# Patient Record
Sex: Male | Born: 1965 | Race: White | Hispanic: No | Marital: Married | Smoking: Never smoker
Health system: Southern US, Community
[De-identification: ages and names within clinical notes are randomized; demographics above are authoritative.]

## PROBLEM LIST (undated history)

## (undated) DIAGNOSIS — K5792 Diverticulitis of intestine, part unspecified, without perforation or abscess without bleeding: Secondary | ICD-10-CM

## (undated) DIAGNOSIS — E119 Type 2 diabetes mellitus without complications: Secondary | ICD-10-CM

## (undated) DIAGNOSIS — K219 Gastro-esophageal reflux disease without esophagitis: Secondary | ICD-10-CM

## (undated) DIAGNOSIS — F419 Anxiety disorder, unspecified: Secondary | ICD-10-CM

## (undated) DIAGNOSIS — G4730 Sleep apnea, unspecified: Secondary | ICD-10-CM

## (undated) DIAGNOSIS — M47816 Spondylosis without myelopathy or radiculopathy, lumbar region: Secondary | ICD-10-CM

## (undated) DIAGNOSIS — I10 Essential (primary) hypertension: Secondary | ICD-10-CM

## (undated) DIAGNOSIS — Z0001 Encounter for general adult medical examination with abnormal findings: Secondary | ICD-10-CM

## (undated) HISTORY — PX: COLONOSCOPY: SHX174

## (undated) HISTORY — DX: Sleep apnea, unspecified: G47.30

## (undated) HISTORY — PX: WISDOM TOOTH EXTRACTION: SHX21

## (undated) HISTORY — DX: Type 2 diabetes mellitus without complications: E11.9

## (undated) HISTORY — DX: Anxiety disorder, unspecified: F41.9

## (undated) HISTORY — DX: Essential (primary) hypertension: I10

## (undated) HISTORY — DX: Gastro-esophageal reflux disease without esophagitis: K21.9

## (undated) HISTORY — DX: Spondylosis without myelopathy or radiculopathy, lumbar region: M47.816

## (undated) HISTORY — DX: Encounter for general adult medical examination with abnormal findings: Z00.01

---

## 2002-10-23 ENCOUNTER — Encounter: Payer: Self-pay | Admitting: Cardiology

## 2002-10-23 ENCOUNTER — Ambulatory Visit (HOSPITAL_COMMUNITY): Admission: RE | Admit: 2002-10-23 | Discharge: 2002-10-23 | Payer: Self-pay | Admitting: Cardiology

## 2004-03-13 ENCOUNTER — Encounter: Payer: Self-pay | Admitting: Internal Medicine

## 2004-03-13 LAB — CONVERTED CEMR LAB: PSA: 0.3 ng/mL

## 2004-10-08 ENCOUNTER — Ambulatory Visit: Payer: Self-pay | Admitting: Gastroenterology

## 2004-10-21 ENCOUNTER — Ambulatory Visit: Payer: Self-pay | Admitting: Gastroenterology

## 2005-12-12 ENCOUNTER — Emergency Department (HOSPITAL_COMMUNITY): Admission: EM | Admit: 2005-12-12 | Discharge: 2005-12-12 | Payer: Self-pay | Admitting: Emergency Medicine

## 2005-12-13 ENCOUNTER — Ambulatory Visit: Payer: Self-pay | Admitting: Internal Medicine

## 2006-01-20 ENCOUNTER — Ambulatory Visit: Payer: Self-pay | Admitting: Internal Medicine

## 2007-02-16 ENCOUNTER — Ambulatory Visit: Payer: Self-pay | Admitting: Internal Medicine

## 2007-08-11 ENCOUNTER — Encounter: Payer: Self-pay | Admitting: Internal Medicine

## 2007-08-11 DIAGNOSIS — E785 Hyperlipidemia, unspecified: Secondary | ICD-10-CM | POA: Insufficient documentation

## 2007-08-11 DIAGNOSIS — N289 Disorder of kidney and ureter, unspecified: Secondary | ICD-10-CM | POA: Insufficient documentation

## 2007-08-11 DIAGNOSIS — F339 Major depressive disorder, recurrent, unspecified: Secondary | ICD-10-CM | POA: Insufficient documentation

## 2007-08-11 DIAGNOSIS — Z8719 Personal history of other diseases of the digestive system: Secondary | ICD-10-CM | POA: Insufficient documentation

## 2007-08-11 DIAGNOSIS — E669 Obesity, unspecified: Secondary | ICD-10-CM | POA: Insufficient documentation

## 2007-08-11 DIAGNOSIS — I10 Essential (primary) hypertension: Secondary | ICD-10-CM

## 2007-08-15 DIAGNOSIS — G4733 Obstructive sleep apnea (adult) (pediatric): Secondary | ICD-10-CM | POA: Insufficient documentation

## 2007-12-11 ENCOUNTER — Ambulatory Visit: Payer: Self-pay | Admitting: Gastroenterology

## 2007-12-21 ENCOUNTER — Ambulatory Visit: Payer: Self-pay | Admitting: Gastroenterology

## 2007-12-21 LAB — CONVERTED CEMR LAB
ALT: 50 units/L (ref 0–53)
AST: 41 units/L — ABNORMAL HIGH (ref 0–37)
Albumin: 4.1 g/dL (ref 3.5–5.2)
Alkaline Phosphatase: 62 units/L (ref 39–117)
Bilirubin, Direct: 0.2 mg/dL (ref 0.0–0.3)
Total Bilirubin: 1.1 mg/dL (ref 0.3–1.2)
Total Protein: 7.4 g/dL (ref 6.0–8.3)

## 2007-12-27 ENCOUNTER — Encounter: Payer: Self-pay | Admitting: Internal Medicine

## 2007-12-27 ENCOUNTER — Ambulatory Visit: Payer: Self-pay | Admitting: Gastroenterology

## 2007-12-27 ENCOUNTER — Encounter: Payer: Self-pay | Admitting: Gastroenterology

## 2008-02-13 ENCOUNTER — Ambulatory Visit: Payer: Self-pay | Admitting: Gastroenterology

## 2008-02-20 ENCOUNTER — Ambulatory Visit (HOSPITAL_COMMUNITY): Admission: RE | Admit: 2008-02-20 | Discharge: 2008-02-20 | Payer: Self-pay | Admitting: Gastroenterology

## 2009-03-04 ENCOUNTER — Ambulatory Visit: Payer: Self-pay | Admitting: Internal Medicine

## 2009-03-04 DIAGNOSIS — N508 Other specified disorders of male genital organs: Secondary | ICD-10-CM

## 2009-03-12 ENCOUNTER — Telehealth: Payer: Self-pay | Admitting: Internal Medicine

## 2009-03-12 ENCOUNTER — Encounter: Admission: RE | Admit: 2009-03-12 | Discharge: 2009-03-12 | Payer: Self-pay | Admitting: Internal Medicine

## 2009-04-01 ENCOUNTER — Ambulatory Visit: Payer: Self-pay | Admitting: Internal Medicine

## 2009-04-01 LAB — CONVERTED CEMR LAB
AST: 41 units/L — ABNORMAL HIGH (ref 0–37)
Alkaline Phosphatase: 63 units/L (ref 39–117)
Eosinophils Absolute: 0.2 10*3/uL (ref 0.0–0.7)
Eosinophils Relative: 2.4 % (ref 0.0–5.0)
GFR calc non Af Amer: 76.83 mL/min (ref >60)
Glucose, Bld: 107 mg/dL — ABNORMAL HIGH (ref 70–99)
HDL: 35.1 mg/dL — ABNORMAL LOW (ref >39.00)
LDL Cholesterol: 107 mg/dL — ABNORMAL HIGH (ref 0–99)
Lymphocytes Relative: 22.1 % (ref 12.0–46.0)
MCV: 86.5 fL (ref 78.0–100.0)
Monocytes Absolute: 0.6 10*3/uL (ref 0.1–1.0)
Neutrophils Relative %: 67.4 % (ref 43.0–77.0)
PSA: 0.8 ng/mL (ref 0.10–4.00)
Platelets: 171 10*3/uL (ref 150.0–400.0)
Potassium: 3.9 meq/L (ref 3.5–5.1)
Sodium: 139 meq/L (ref 135–145)
Total Bilirubin: 1.7 mg/dL — ABNORMAL HIGH (ref 0.3–1.2)
Total CHOL/HDL Ratio: 5
Urobilinogen, UA: 0.2 (ref 0.0–1.0)
VLDL: 23.4 mg/dL (ref 0.0–40.0)
WBC: 8 10*3/uL (ref 4.5–10.5)

## 2009-04-07 ENCOUNTER — Ambulatory Visit: Payer: Self-pay | Admitting: Internal Medicine

## 2009-04-07 DIAGNOSIS — R9431 Abnormal electrocardiogram [ECG] [EKG]: Secondary | ICD-10-CM | POA: Insufficient documentation

## 2009-04-07 DIAGNOSIS — F528 Other sexual dysfunction not due to a substance or known physiological condition: Secondary | ICD-10-CM | POA: Insufficient documentation

## 2009-04-07 DIAGNOSIS — R030 Elevated blood-pressure reading, without diagnosis of hypertension: Secondary | ICD-10-CM | POA: Insufficient documentation

## 2009-04-07 DIAGNOSIS — J069 Acute upper respiratory infection, unspecified: Secondary | ICD-10-CM | POA: Insufficient documentation

## 2009-04-07 DIAGNOSIS — F411 Generalized anxiety disorder: Secondary | ICD-10-CM | POA: Insufficient documentation

## 2009-04-17 ENCOUNTER — Ambulatory Visit: Payer: Self-pay

## 2009-04-17 ENCOUNTER — Encounter: Payer: Self-pay | Admitting: Internal Medicine

## 2009-04-27 ENCOUNTER — Ambulatory Visit: Payer: Self-pay | Admitting: Professional

## 2009-04-27 ENCOUNTER — Ambulatory Visit: Payer: Self-pay | Admitting: Gastroenterology

## 2009-06-15 ENCOUNTER — Ambulatory Visit: Payer: Self-pay | Admitting: Professional

## 2009-07-09 ENCOUNTER — Ambulatory Visit: Payer: Self-pay | Admitting: Professional

## 2009-07-20 ENCOUNTER — Ambulatory Visit: Payer: Self-pay | Admitting: Professional

## 2009-07-27 ENCOUNTER — Ambulatory Visit: Payer: Self-pay | Admitting: Professional

## 2009-08-20 ENCOUNTER — Ambulatory Visit: Payer: Self-pay | Admitting: Professional

## 2009-09-05 ENCOUNTER — Emergency Department (HOSPITAL_COMMUNITY): Admission: EM | Admit: 2009-09-05 | Discharge: 2009-09-05 | Payer: Self-pay | Admitting: Emergency Medicine

## 2009-09-06 ENCOUNTER — Encounter (INDEPENDENT_AMBULATORY_CARE_PROVIDER_SITE_OTHER): Payer: Self-pay | Admitting: Emergency Medicine

## 2009-09-06 ENCOUNTER — Ambulatory Visit (HOSPITAL_COMMUNITY): Admission: RE | Admit: 2009-09-06 | Discharge: 2009-09-06 | Payer: Self-pay | Admitting: Emergency Medicine

## 2009-09-06 ENCOUNTER — Ambulatory Visit: Payer: Self-pay | Admitting: Vascular Surgery

## 2009-09-08 ENCOUNTER — Ambulatory Visit: Payer: Self-pay | Admitting: Internal Medicine

## 2009-09-08 DIAGNOSIS — M7989 Other specified soft tissue disorders: Secondary | ICD-10-CM

## 2009-09-08 DIAGNOSIS — S8010XA Contusion of unspecified lower leg, initial encounter: Secondary | ICD-10-CM

## 2009-09-09 ENCOUNTER — Ambulatory Visit (HOSPITAL_COMMUNITY): Admission: RE | Admit: 2009-09-09 | Discharge: 2009-09-09 | Payer: Self-pay | Admitting: Sports Medicine

## 2009-09-10 ENCOUNTER — Encounter: Payer: Self-pay | Admitting: Internal Medicine

## 2009-12-07 ENCOUNTER — Telehealth: Payer: Self-pay | Admitting: Internal Medicine

## 2010-01-01 ENCOUNTER — Telehealth: Payer: Self-pay | Admitting: Internal Medicine

## 2010-01-01 ENCOUNTER — Ambulatory Visit: Payer: Self-pay | Admitting: Internal Medicine

## 2010-01-01 DIAGNOSIS — S61409A Unspecified open wound of unspecified hand, initial encounter: Secondary | ICD-10-CM | POA: Insufficient documentation

## 2010-01-13 ENCOUNTER — Ambulatory Visit: Payer: Self-pay | Admitting: Internal Medicine

## 2010-08-30 ENCOUNTER — Ambulatory Visit: Payer: Self-pay | Admitting: Internal Medicine

## 2010-08-30 ENCOUNTER — Encounter: Payer: Self-pay | Admitting: Internal Medicine

## 2010-08-30 DIAGNOSIS — M79609 Pain in unspecified limb: Secondary | ICD-10-CM | POA: Insufficient documentation

## 2010-08-30 DIAGNOSIS — M549 Dorsalgia, unspecified: Secondary | ICD-10-CM | POA: Insufficient documentation

## 2010-08-31 ENCOUNTER — Ambulatory Visit: Payer: Self-pay

## 2010-08-31 ENCOUNTER — Encounter: Payer: Self-pay | Admitting: Internal Medicine

## 2010-10-27 ENCOUNTER — Ambulatory Visit: Payer: Self-pay | Admitting: Internal Medicine

## 2010-10-28 LAB — CONVERTED CEMR LAB
ALT: 38 units/L (ref 0–53)
AST: 31 units/L (ref 0–37)
Albumin: 4.5 g/dL (ref 3.5–5.2)
Bilirubin, Direct: 0.1 mg/dL (ref 0.0–0.3)
CO2: 26 meq/L (ref 19–32)
Calcium: 9.2 mg/dL (ref 8.4–10.5)
Glucose, Bld: 107 mg/dL — ABNORMAL HIGH (ref 70–99)
HDL: 33 mg/dL — ABNORMAL LOW (ref >39)
PSA: 0.62 ng/mL (ref <=4.00)
Potassium: 4.2 meq/L (ref 3.5–5.3)
Sodium: 139 meq/L (ref 135–145)
TSH: 3.562 microintl units/mL (ref 0.350–4.500)
Total Bilirubin: 0.7 mg/dL (ref 0.3–1.2)
Total CHOL/HDL Ratio: 4.9
VLDL: 36 mg/dL (ref 0–40)

## 2010-10-29 LAB — CONVERTED CEMR LAB
Basophils Absolute: 0.1 10*3/uL (ref 0.0–0.1)
Eosinophils Absolute: 0.3 10*3/uL (ref 0.0–0.7)
Hemoglobin: 16.4 g/dL (ref 13.0–17.0)
Leukocytes, UA: NEGATIVE
Lymphocytes Relative: 30.4 % (ref 12.0–46.0)
MCHC: 35.4 g/dL (ref 30.0–36.0)
Monocytes Relative: 8.9 % (ref 3.0–12.0)
Neutrophils Relative %: 55.6 % (ref 43.0–77.0)
Nitrite: NEGATIVE
RBC: 5.27 M/uL (ref 4.22–5.81)
RDW: 13.2 % (ref 11.5–14.6)
Specific Gravity, Urine: 1.015 (ref 1.000–1.030)
Urobilinogen, UA: 1 (ref 0.0–1.0)
pH: 8 (ref 5.0–8.0)

## 2010-11-02 ENCOUNTER — Ambulatory Visit: Payer: Self-pay | Admitting: Internal Medicine

## 2010-11-08 ENCOUNTER — Encounter: Payer: Self-pay | Admitting: Internal Medicine

## 2010-12-10 ENCOUNTER — Encounter: Payer: Self-pay | Admitting: Internal Medicine

## 2011-01-04 NOTE — Assessment & Plan Note (Signed)
Summary: PER DR AVP--12 DY SUCTURE REMOVAL--STC   Vital Signs:  Patient profile:   45 year old male Weight:      286 pounds Temp:     97.8 degrees F oral Pulse rate:   68 / minute BP sitting:   136 / 80  (left arm)  Vitals Entered By: Tora Perches (January 13, 2010 9:50 AM) CC: suture removal Is Patient Diabetic? No   Primary Care Provider:  Oliver Barre, MD  CC:  suture removal.  History of Present Illness: For suture removal  Preventive Screening-Counseling & Management  Alcohol-Tobacco     Smoking Status: never  Current Medications (verified): 1)  Diazepam 5 Mg Tabs (Diazepam) .Marland Kitchen.. 1p O Once Daily As Needed 2)  Cialis 20 Mg Tabs (Tadalafil) .Marland Kitchen.. 1 By Mouth Every Other Day As Needed  Allergies: 1)  ! * Niaspan 2)  ! * ?nerve Pill  Physical Exam  Skin:  wound healed   Impression & Recommendations:  Problem # 1:  WOUND, HAND (ICD-882.0) Assessment Improved  Sutures removed  Orders: No Charge Patient Arrived (NCPA0) (NCPA0)  Complete Medication List: 1)  Diazepam 5 Mg Tabs (Diazepam) .Marland Kitchen.. 1p o once daily as needed 2)  Cialis 20 Mg Tabs (Tadalafil) .Marland Kitchen.. 1 by mouth every other day as needed

## 2011-01-04 NOTE — Assessment & Plan Note (Signed)
Summary: PHYSICAL  STC   Vital Signs:  Patient profile:   45 year old male Height:      74 inches Weight:      290 pounds BMI:     37.37 O2 Sat:      94 % on Room air Temp:     99.3 degrees F oral Pulse rate:   74 / minute BP sitting:   118 / 72  (left arm) Cuff size:   large  Vitals Entered By: Zella Ball Ewing CMA Duncan Dull) (November 02, 2010 10:48 AM)  O2 Flow:  Room air  CC: Adult Physical/RE   Primary Care Provider:  Oliver Barre, MD  CC:  Adult Physical/RE.  History of Present Illness: here for wellness, unfortunately gained 30 lbs in the past yr with his orthopedic usses (left leg, then back pain), really no excercise significant for 6 months, fearful of re-injurying himself, has not seen ortho or other for his back. Pain still with low mid back and right side, , sometimes very severe alhtough some days little pain; nobowel or bladder changes, no LE pain/weak/numb, gait change or fall.      Pt denies CP, worsening sob, doe, wheezing, orthopnea, pnd, worsening LE edema, palps, dizziness or syncope  Pt denies new neuro symptoms such as headache, facial or extremity weakness  Pt denies polydipsia, polyuria, or low sugar symptoms such as shakiness improved with eating.  Overall good compliance with meds, trying to follow low chol  diet, little excercise however  Never filled the diazepam from last visit due to fear or somnolence.  Denies worsening depressive symptoms, suicidal ideation, or panic but has marked ongoing anxiety issues. has seen Dr Kaur/psychiatry and tried several SSRI in the past - did not seem to help so stopped, also had side effect with anorgasmia, and irritiable, and hyperhidrosis.  Overall good compliance with meds, and good tolerability.  No fever, wt loss, night sweats, loss of appetite or other constitutional symptoms Pt states good ability with ADL's, low fall risk, home safety reviewed and adequate, no significant change in hearing or vision, trying to follow lower  chol diet, and occasionally active only with regular excercise.   Problems Prior to Update: 1)  Leg Pain, Left  (ICD-729.5) 2)  Back Pain  (ICD-724.5) 3)  Arm Pain, Left  (ICD-729.5) 4)  Wound, Hand  (ICD-882.0) 5)  Contusion of Lower Leg  (ICD-924.10) 6)  Swelling of Limb  (ICD-729.81) 7)  Uri  (ICD-465.9) 8)  Erectile Dysfunction  (ICD-302.72) 9)  Anxiety  (ICD-300.00) 10)  Electrocardiogram, Abnormal  (ICD-794.31) 11)  Elevated Blood Pressure Without Diagnosis of Hypertension  (ICD-796.2) 12)  Preventive Health Care  (ICD-V70.0) 13)  Scrotal Mass  (ICD-608.89) 14)  Hypertension  (ICD-401.9) 15)  Hyperlipidemia  (ICD-272.4) 16)  Depression  (ICD-311) 17)  Fatty Liver Disease, Hx of  (ICD-V12.79) 18)  Obesity  (ICD-278.00) 19)  Sleep Apnea, Obstructive  (ICD-327.23) 20)  Nephrosis  (ICD-593.9)  Medications Prior to Update: 1)  Diazepam 5 Mg Tabs (Diazepam) .Marland Kitchen.. 1 Po Once Daily As Needed  Current Medications (verified): 1)  Flexeril 5 Mg Tabs (Cyclobenzaprine Hcl) .Marland Kitchen.. 1 By Mouth Three Times A Day As Needed 2)  Clonazepam 0.5 Mg Tabs (Clonazepam) .Marland Kitchen.. 1po Two Times A Day As Needed  For Nerves  Allergies (verified): 1)  ! * Niaspan 2)  ! * ?nerve Pill  Past History:  Past Surgical History: Last updated: 08/11/2007 Denies surgical history  Family History: Last updated: 04/27/2009 father  died with pancreatic cancer at 44yo grandmotherwith heart diseaes - died at 17yo No FH of Colon Cancer:  Social History: Last updated: 04/27/2009 Occupation: Airline pilot - organic foods Married 2 daughters Never Smoked Alcohol use-yes - socal only Daily Caffeine Use - unsweet tea Illicit Drug Use - no  Risk Factors: Smoking Status: never (01/13/2010)  Past Medical History: Depression Hyperlipidemia Hypertension Obstructive Sleep Apnea, CPAP Obesity Fatty Liver Disease Nephrosis Esophageal Stricture with dilation 2009 Colon Polyps Anxiety LE varicose veins/venous  insufficien cy  Review of Systems  The patient denies anorexia, fever, vision loss, decreased hearing, hoarseness, chest pain, syncope, dyspnea on exertion, peripheral edema, prolonged cough, headaches, hemoptysis, abdominal pain, melena, hematochezia, severe indigestion/heartburn, hematuria, muscle weakness, suspicious skin lesions, transient blindness, depression, unusual weight change, abnormal bleeding, enlarged lymph nodes, and angioedema.         all otherwise negative per pt -    Physical Exam  General:  alert and overweight-appearing.   Head:  normocephalic and atraumatic.   Eyes:  vision grossly intact, pupils equal, and pupils round.   Ears:  R ear normal and L ear normal.   Nose:  no external deformity and no nasal discharge.   Mouth:  no gingival abnormalities and pharynx pink and moist.   Neck:  supple and no masses.   Lungs:  normal respiratory effort and normal breath sounds.   Heart:  normal rate and regular rhythm.   Abdomen:  soft, non-tender, and normal bowel sounds.   Msk:  no joint tenderness and no joint swelling.  , spine nontender, no significant paravertebral tender, swelling or rash noted Extremities:  no edema, no erythema  Neurologic:  strength normal in all extremities, sensation intact to light touch, gait normal, and DTRs symmetrical and normal.   Skin:  color normal and no rashes.   Psych:  not depressed appearing and moderately anxious.     Impression & Recommendations:  Problem # 1:  Preventive Health Care (ICD-V70.0) Overall doing well, age appropriate education and counseling updated, referral for preventive services and immunizations addressed, dietary counseling and smoking status adressed , most recent labs reviewed I have personally reviewed and have noted 1.The patient's medical and social history 2.Their use of alcohol, tobacco or illicit drugs 3.Their current medications and supplements 4. Functional ability including ADL's, fall risk,  home safety risk, hearing & visual impairment  5.Diet and physical activities 6.Evidence for depression or mood disorders The patients weight, height, BMI  have been recorded in the chart I have made referrals, counseling and provided education to the patient based review of the above   Problem # 2:  BACK PAIN (ICD-724.5)  ongoing now chronic, assoc with reduced activity and increase wt, fearful of doing any excericse - will refer to orthopedic for further eval, for flexeril as needed, hopefuly to get to playing bball and wt loss  Orders: Orthopedic Surgeon Referral (Ortho Surgeon)  His updated medication list for this problem includes:    Flexeril 5 Mg Tabs (Cyclobenzaprine hcl) .Marland Kitchen... 1 by mouth three times a day as needed  Problem # 3:  ANXIETY (ICD-300.00)  The following medications were removed from the medication list:    Diazepam 5 Mg Tabs (Diazepam) .Marland Kitchen... 1 po once daily as needed His updated medication list for this problem includes:    Clonazepam 0.5 Mg Tabs (Clonazepam) .Marland Kitchen... 1po two times a day as needed  for nerves treat as above, f/u any worsening signs or symptoms ., declines SSRI trial  Complete Medication List: 1)  Flexeril 5 Mg Tabs (Cyclobenzaprine hcl) .Marland Kitchen.. 1 by mouth three times a day as needed 2)  Clonazepam 0.5 Mg Tabs (Clonazepam) .Marland Kitchen.. 1po two times a day as needed  for nerves  Patient Instructions: 1)  Please take all new medications as prescribed 2)  Continue all previous medications as before this visit  3)  You will be contacted about the referral(s) to: orthopedic 4)  Please schedule a follow-up appointment in 1 year, or sooner if needed Prescriptions: CLONAZEPAM 0.5 MG TABS (CLONAZEPAM) 1po two times a day as needed  for nerves  #60 x 2   Entered and Authorized by:   Corwin Levins MD   Signed by:   Corwin Levins MD on 11/02/2010   Method used:   Print then Give to Patient   RxID:   [id number] FLEXERIL 5 MG TABS (CYCLOBENZAPRINE HCL) 1 by mouth  three times a day as needed  #90 x 1   Entered and Authorized by:   Corwin Levins MD   Signed by:   Corwin Levins MD on 11/02/2010   Method used:   Print then Give to Patient   RxID:   [id number]    Orders Added: 1)  Orthopedic Surgeon Referral Gaylord Shih Surgeon] 2)  Est. Patient 40-64 years [phone]

## 2011-01-04 NOTE — Progress Notes (Signed)
Summary: Wound on hand  Phone Note Call from Patient   Summary of Call: Pt cut his hand with a box cutter about 1 & 1/2 hours ago. Per pt wound is approx 1 inch long and "deep". He has been applying direct pressure and it has not stopped bleeding. He does not want to go to the ER and feels that the cut may need sutures. Ok per Dr Posey Rea to add to open 3 pm 30 apt. Pt informed, added to schedule.  Initial call taken by: Lamar Sprinkles, CMA,  January 01, 2010 1:28 PM  Follow-up for Phone Call        Thx Follow-up by: Tresa Garter MD,  January 01, 2010 4:59 PM

## 2011-01-04 NOTE — Assessment & Plan Note (Signed)
Summary: cut on hand-lb   Vital Signs:  Patient profile:   45 year old male Weight:      286 pounds Temp:     98.2 degrees F oral Pulse rate:   75 / minute BP sitting:   142 / 88  Vitals Entered By: Tora Perches (January 01, 2010 2:24 PM)  CC: cut right hand---need sutures Is Patient Diabetic? No   Primary Care Provider:  Oliver Barre, MD  CC:  cut right hand---need sutures.  History of Present Illness: Pt was worked in for a L hand cut w/a boxcutter earlier today. He bled a lot.  Preventive Screening-Counseling & Management  Alcohol-Tobacco     Smoking Status: never  Current Medications (verified): 1)  Diazepam 5 Mg Tabs (Diazepam) .Marland Kitchen.. 1p O Once Daily As Needed 2)  Cialis 20 Mg Tabs (Tadalafil) .Marland Kitchen.. 1 By Mouth Every Other Day As Needed  Allergies: 1)  ! * Niaspan 2)  ! 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   His pants a soaked with blood over B anter thighs, shirt stained a lot. No palor Skin:  2.6 cm lacer L hand at the base of his thumb - still bleeding   Impression & Recommendations:  Problem # 1:  WOUND, HAND (ICD-882.0) w/acute bleeding Assessment New  3.0 ethilone x 5 sutures dT up-to-date Risks including but not limited to bleeding, infection were discussed with the patient. Consent form was signed.  Procedure: laceration repair The area was cleaned w/betadine and injected with 4.0 cc of 2% Lido w/epi. The wound was cleaned and redressed  w/a fenestrated sheet. 5 simple sutures with 3.0 ethylon applied. Wound dressed. Wound care instructions provided. RTC for suture removal in 12 d Tolerated well. Complicatons - none.   Orders: Ace Wraps 3-5 in/yard  (Z6109) Lacerat Simple STE 2.6 - 7.5 cm (12002)  Complete Medication List: 1)  Diazepam 5 Mg Tabs (Diazepam) .Marland Kitchen.. 1p o once daily as needed 2)  Cialis 20 Mg Tabs (Tadalafil) .Marland Kitchen.. 1 by mouth every other day as needed  Patient Instructions: 1)  RTC 12 d

## 2011-01-04 NOTE — Progress Notes (Signed)
Summary: med refill  Phone Note Refill Request  on December 07, 2009 11:33 AM  Refills Requested: Medication #1:  DIAZEPAM 5 MG TABS 1p o once daily as needed   Dosage confirmed as above?Dosage Confirmed   Notes: [facility] [phone] Initial call taken by: Scharlene Gloss,  December 07, 2009 11:33 AM  Follow-up for Phone Call        done hardcopy to LIM side B - dahlia  Follow-up by: Corwin Levins MD,  December 07, 2009 1:05 PM  Additional Follow-up for Phone Call Additional follow up Details #1::        rx faxed to pharmacy Additional Follow-up by: Margaret Pyle, CMA,  December 07, 2009 2:10 PM    New/Updated Medications: DIAZEPAM 5 MG TABS (DIAZEPAM) 1p o once daily as needed Prescriptions: DIAZEPAM 5 MG TABS (DIAZEPAM) 1p o once daily as needed  #30 x 3   Entered and Authorized by:   Corwin Levins MD   Signed by:   Corwin Levins MD on 12/07/2009   Method used:   Print then Give to Patient   RxID:   [id number]

## 2011-01-04 NOTE — Miscellaneous (Signed)
Summary: Orders Update  Clinical Lists Changes  Orders: Added new Test order of Venous Duplex Lower Extremity (Venous Duplex Lower) - Signed  Appended Document: Orders Update recieved phone from vasc lab earlier this AM  - neg for DVT,  LMOPT - labs negative, normal, or stable

## 2011-01-04 NOTE — Assessment & Plan Note (Signed)
Summary: SWOLLEN LEG/NWS   Vital Signs:  Patient profile:   45 year old male Height:      72 inches Weight:      288.13 pounds BMI:     39.22 O2 Sat:      93 % on Room air Temp:     98.4 degrees F oral Pulse rate:   77 / minute BP sitting:   132 / 80  (left arm) Cuff size:   large  Vitals Entered By: Zella Ball Ewing CMA Duncan Dull) (August 30, 2010 2:42 PM)  O2 Flow:  Room air CC: left leg swollen and painful, back and left arm pain/RE   Primary Care Provider:  Oliver Barre, MD  CC:  left leg swollen and painful and back and left arm pain/RE.  History of Present Illness: here with acute  - c/o primarily left calf pain, tenderness and swelling for 2 days, without knee or upper leg symptoms.  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  Pt denies new neuro symptoms such as headache, facial or extremity weakness  No fever, wt loss, night sweats, loss of appetite or other constitutional symptoms  Does have hx of partial tear to the same calf area earlier in the year tx conservatively and healed with crutches , elev and non wt bearing for 4 wks (see recent ortho note).  No falls or other injury or heavy lifting.  No hx of DVT/PE .   Also c/o vague discomfort, achy, dull , intermittent to distal LUE (? wrist and hand), as well as area just medial to the left scapula,  all without tenderness, sweling, erythema , rash or drainage, bowel or bladder change, gait change, fever or wt loss.  Denies worsening depressive symtpoms, but states has ongoing anxiety, mild to mod, having done well with diazepam.  No panic symptoms  Problems Prior to Update: 1)  Leg Pain, Left  (ICD-729.5) 2)  Back Pain  (ICD-724.5) 3)  Arm Pain, Left  (ICD-729.5) 4)  Wound, Hand  (ICD-882.0) 5)  Contusion of Lower Leg   (ICD-924.10) 6)  Swelling of Limb  (ICD-729.81) 7)  Uri  (ICD-465.9) 8)  Erectile Dysfunction  (ICD-302.72) 9)  Anxiety  (ICD-300.00) 10)  Electrocardiogram, Abnormal  (ICD-794.31) 11)  Elevated Blood Pressure Without Diagnosis of Hypertension  (ICD-796.2) 12)  Preventive Health Care  (ICD-V70.0) 13)  Scrotal Mass  (ICD-608.89) 14)  Hypertension  (ICD-401.9) 15)  Hyperlipidemia  (ICD-272.4) 16)  Depression  (ICD-311) 17)  Fatty Liver Disease, Hx of  (ICD-V12.79) 18)  Obesity  (ICD-278.00) 19)  Sleep Apnea, Obstructive  (ICD-327.23) 20)  Nephrosis  (ICD-593.9)  Medications Prior to Update: 1)  Diazepam 5 Mg Tabs (Diazepam) .Marland Kitchen.. 1p O Once Daily As Needed 2)  Cialis 20 Mg Tabs (Tadalafil) .Marland Kitchen.. 1 By Mouth Every Other Day As Needed  Current Medications (verified): 1)  Diazepam 5 Mg Tabs (Diazepam) .Marland Kitchen.. 1 Po Once Daily As Needed  Allergies (verified): 1)  ! * Niaspan 2)  ! 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   Head:  normocephalic and atraumatic.   Eyes:  vision grossly intact, pupils equal, and pupils round.   Ears:  R ear normal and L ear normal.   Nose:  no external deformity and no nasal discharge.   Mouth:  no gingival abnormalities and pharynx pink and moist.   Neck:  supple and no masses.   Lungs:  normal  respiratory effort and normal breath sounds.   Heart:  normal rate and regular rhythm.   Msk:  no joint tenderness and no joint swelling.  , but does have mild edema  without erythema or rash to the left leg below the knee, with tender without cords to the left post calf, and equivocal  homans sign  also no spine or parasplinal tedner or sweling Pulses:  1+ dorsalis pedis  bilat Extremities:  no edema, no erythema  Neurologic:  cranial nerves II-XII intact, strength normal in all extremities, and gait somewhat antalgic favoring the LLEcranial nerves II-XII intact, strength normal in all extremities, sensation intact to light touch, and gait normal.   Psych:  dysphoric affect and moderately anxious.     Impression & Recommendations:  Problem # 1:  SWELLING OF LIMB (ICD-729.81)  and left calf/leg pain - for LLE venous doppler today stat , pain control  Orders: Radiology Referral (Radiology)  Problem # 2:  ARM PAIN, LEFT (ICD-729.5)  c/w MSK vs neuritic, for ecg today, but mild and pt declines further eval at this time  Orders: EKG w/ Interpretation (93000)  Problem # 3:  BACK PAIN (ICD-724.5) exam benign, ok to follow  Problem # 4:  ANXIETY (ICD-300.00)  His updated medication list for this problem includes:    Diazepam 5 Mg Tabs (Diazepam) .Marland Kitchen... 1 po once daily as needed treat as above, f/u any worsening signs or symptoms   Complete Medication List: 1)  Diazepam 5 Mg Tabs (Diazepam) .Marland Kitchen.. 1 po once daily as needed  Patient Instructions: 1)  You will be contacted about the referral(s) to: PCC's today for the stat left leg doppler venous ultrasound 2)  Your EKG was good today 3)  Please call or return if symptoms related to the left arm or back pain seem worse 4)  Continue all previous medications as before this visit  5)  Please schedule a follow-up appointment in 1 month with CPX labs  Prescriptions: DIAZEPAM 5 MG TABS (DIAZEPAM) 1 po once daily as needed  #30 x 5    Entered and Authorized by:   Corwin Levins MD   Signed by:   Corwin Levins MD on 08/30/2010   Method used:   Print then Give to Patient   RxID:   [id number]

## 2011-01-04 NOTE — Letter (Signed)
Summary: [facility]  [facility]   Imported By: Lester Etna 11/12/2010 09:07:36  _____________________________________________________________________  External Attachment:    Type:   Image     Comment:   External Document

## 2011-01-06 NOTE — Letter (Signed)
Summary: Nat Math DO/[facility]   T Frazier Butt DO/[facility]   Imported By: Lester Eunice 12/29/2010 10:31:46  _____________________________________________________________________  External Attachment:    Type:   Image     Comment:   External Document

## 2011-04-19 NOTE — Assessment & Plan Note (Signed)
[facility]                         GASTROENTEROLOGY OFFICE NOTE   William, [name]                      MRN:          [id number]  DATE:02/13/2008                            DOB:          February 12, 1966    PROBLEM:  Dysphagia.   William [name] is a 45 year old white male returning following upper  endoscopy and colonoscopy.  He has dysphagia prompted on upper  endoscopy, which demonstrated a distal esophageal stricture.  There were  some linear furrows suggesting eosinophilic esophagitis.  Biopsies did  demonstrate increased esophagitis, though it was felt that this was more  likely due to reflux esophagitis.  He had mild duodenitis as well.  He  has a history of an adenomatous colon polyp, though the most recent  colonoscopy was negative.  He also has history of hepatic steatosis, and  underwent ultrasound in 2001.  William [name] has had 1 episode of mild  dysphagia to solids since his dilatation.  Prior to that he had been  having multiple episodes.   PAST MEDICAL HISTORY:  Unremarkable.   FAMILY HISTORY:  Pertinent for father who had pancreatic cancer at 3.   MEDICATIONS:  The only medication is baby aspirin.   HE IS ALLERGIC TO NIASPAN.   He does not smoke.  He drinks rarely.  He is married and works in Airline pilot.   REVIEW OF SYSTEMS:  Negative.   EXAMINATION:  Pulse 80.  Blood pressure 146/94.  Weight 300.  HEENT: EOMI.  PERRLA.  Sclerae are anicteric.  Conjunctivae are pink.  NECK:  Supple without thyromegaly, adenopathy or carotid bruits.  CHEST:  Clear to auscultation and percussion without adventitious  sounds.  CARDIAC:  Regular rhythm; normal S1 S2.  There are no murmurs, gallops  or rubs.  ABDOMEN:  Bowel sounds are normoactive.  Abdomen is soft, nontender and  nondistended.  There are no abdominal masses, tenderness, splenic  enlargement or hepatomegaly.  EXTREMITIES:  Full range of motion.  No cyanosis, clubbing or edema.  RECTAL:   Deferred.   IMPRESSION:  1. Mildly symptomatic distal esophageal stricture.  Question of      eosinophilic esophagitis was raised, though I think it is less      likely.  2. History of colon polyps.  3. Hepatic steatosis.   RECOMMENDATIONS:  1. Repeat dilatation as needed.  2. Trial of Protonix 40 mg a day.  3. Followup ultrasound to reassess the liver.     Barbette Hair. Arlyce Dice, MD,FACG  Electronically Signed    RDK/MedQ  DD: 02/13/2008  DT: 02/13/2008  Job #: [id number]

## 2011-04-22 NOTE — Cardiovascular Report (Signed)
William [name], William [name]                         ACCOUNT NO.:  [id number]   MEDICAL RECORD NO.:  [id number]                   PATIENT TYPE:  OIB   LOCATION:  2899                                 FACILITY:  [facility]   PHYSICIAN:  Cristy Hilts. Jacinto Halim, M.D.                  DATE OF BIRTH:  1966/04/11   DATE OF PROCEDURE:  10/23/2002  DATE OF DISCHARGE:                              CARDIAC CATHETERIZATION   REFERRING PHYSICIANS:  Louanna Raw, M.D. and Dr. Dara Hoyer   PROCEDURES PERFORMED:  Left heart catheterization including:  1. Left ventriculography.  2. Selective right and left coronary arteriography.  3. Abdominal aortogram with visualization of renal arteries.  4. Right femoral angiography with closure of right femoral artery access     with Perclose.   INDICATION:  Mr. William [name] is a 45 year old gentleman with significant  family history of premature coronary artery disease and history of  hypertension, who underwent a Cardiolite stress test which revealed anterior  wall ischemia.  Given this, he was brought to the cardiac catheterization  laboratory to evaluate his coronary anatomy.   HEMODYNAMIC DATA:  The left ventricular pressures 104/4 with a end-diastolic  pressure of 15 mmHg. The aortic pressure was 97/66 with a mean of 81 mmHg.  There was no pressure gradient across the aortic valve.   ANGIOGRAPHIC DATA:   LEFT VENTRICULOGRAM:  The left ventricular systolic function was normal.  The ejection fraction was estimated at 55%.  There was no wall motion  abnormality.   Right coronary artery:  The right coronary artery is a large caliber vessel.  It is a dominant vessel.  It gives a very large posterior and left  ventricular branch.  It is disease-free.   Left main coronary artery:  The left main coronary artery is a large caliber  vessel. It is disease-free.   Left anterior descending artery:  Left anterior descending artery is a large  caliber vessel.  It gives rise to  a small diagonal and several small septal  perforators.  This wraps around the apex.  In the mid segment of the left  anterior descending artery, it dips down intramyocardially and there is  significant kinking noted with mild myocardial bridging effect noted in the  mid left anterior descending artery.  Otherwise the vessel itself is very  smooth and is normal.   Ramus intermediate:  The ramus intermediate is a very large artery.  It  replaces the diagonal #1.  It is disease-free.   Circumflex coronary artery:  The circumflex coronary artery is a large  caliber vessel.  It gives origin to a moderate sized obtuse marginal #1 and  continues as obtuse marginal #2.  It is disease-free.   IMPRESSION:  1. Normal left ventricular systolic function with ejection fraction of 55%.  2. Slow filling noted in the coronaries but no plaques.  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  3. The coronary arteries are otherwise normal except the left anterior     descending dips down into the myocardium with mild intramyocardial     bridging in the mid segment probably explaining his ischemia in the     Cardiolite stress test.   RECOMMENDATIONS:  Based on his coronary anatomy, continued medical therapy  is advised with aggressive control of hypertension, obesity with weight  reduction.   TECHNIQUE OF PROCEDURE:  Under the usual sterile precautions, using a 6  French right femoral artery access, a 6 [place] multipurpose B2 catheter was  advanced into the ascending aorta over a 0.035 inch J wire.  The catheter  was gently advanced to the left ventricle and left ventricular pressures  were monitored. Hand contrast injection of the left ventricle was performed  both in LAO and RAO projections.  The catheter was  flushed with saline and pulled back into the ascending aorta and pressure  gradient across the aortic valve was monitored.  The right coronary artery  was selectively engaged and  angiography was performed.  Then the catheter  was pulled back into the abdominal aorta and abdominal aortogram was  performed.  Then the catheter was pulled out of the body.   The left main coronary artery was selective engaged with a 6 French  diagnostic left Judkins catheter in the usual fashion, and angiography was  performed.  Then the catheter was pulled out of the body.  Right femoral  angiography was performed through the arterial access sheath and access was  closed with Perclose with excellent hemostasis obtained.  The patient was  transferred to recovery room in stable condition.  The patient tolerated the  procedure well.                                                  Cristy Hilts. Jacinto Halim, M.D.    Pilar Plate  D:  10/23/2002  T:  10/23/2002  Job:  [id number]

## 2012-08-07 ENCOUNTER — Ambulatory Visit (INDEPENDENT_AMBULATORY_CARE_PROVIDER_SITE_OTHER): Payer: 59 | Admitting: Professional

## 2012-08-07 DIAGNOSIS — F4322 Adjustment disorder with anxiety: Secondary | ICD-10-CM

## 2012-12-17 ENCOUNTER — Encounter: Payer: Self-pay | Admitting: Gastroenterology

## 2013-04-30 ENCOUNTER — Ambulatory Visit (INDEPENDENT_AMBULATORY_CARE_PROVIDER_SITE_OTHER): Payer: 59 | Admitting: Professional

## 2013-04-30 DIAGNOSIS — F4322 Adjustment disorder with anxiety: Secondary | ICD-10-CM

## 2013-05-07 ENCOUNTER — Ambulatory Visit (INDEPENDENT_AMBULATORY_CARE_PROVIDER_SITE_OTHER): Payer: 59 | Admitting: Professional

## 2013-05-07 DIAGNOSIS — F4322 Adjustment disorder with anxiety: Secondary | ICD-10-CM

## 2013-05-14 ENCOUNTER — Ambulatory Visit (INDEPENDENT_AMBULATORY_CARE_PROVIDER_SITE_OTHER): Payer: 59 | Admitting: Professional

## 2013-05-14 DIAGNOSIS — F4322 Adjustment disorder with anxiety: Secondary | ICD-10-CM

## 2013-05-21 ENCOUNTER — Ambulatory Visit (INDEPENDENT_AMBULATORY_CARE_PROVIDER_SITE_OTHER): Payer: 59 | Admitting: Professional

## 2013-05-21 DIAGNOSIS — F4322 Adjustment disorder with anxiety: Secondary | ICD-10-CM

## 2013-05-31 ENCOUNTER — Encounter: Payer: Self-pay | Admitting: Gastroenterology

## 2013-06-04 ENCOUNTER — Ambulatory Visit (INDEPENDENT_AMBULATORY_CARE_PROVIDER_SITE_OTHER): Payer: 59 | Admitting: Professional

## 2013-06-04 DIAGNOSIS — F4322 Adjustment disorder with anxiety: Secondary | ICD-10-CM

## 2013-06-11 ENCOUNTER — Ambulatory Visit (INDEPENDENT_AMBULATORY_CARE_PROVIDER_SITE_OTHER): Payer: 59 | Admitting: Professional

## 2013-06-11 DIAGNOSIS — F4322 Adjustment disorder with anxiety: Secondary | ICD-10-CM

## 2013-06-18 ENCOUNTER — Ambulatory Visit (INDEPENDENT_AMBULATORY_CARE_PROVIDER_SITE_OTHER): Payer: 59 | Admitting: Professional

## 2013-06-18 DIAGNOSIS — F4322 Adjustment disorder with anxiety: Secondary | ICD-10-CM

## 2013-06-25 ENCOUNTER — Ambulatory Visit (INDEPENDENT_AMBULATORY_CARE_PROVIDER_SITE_OTHER): Payer: 59 | Admitting: Professional

## 2013-06-25 DIAGNOSIS — F4322 Adjustment disorder with anxiety: Secondary | ICD-10-CM

## 2013-07-02 ENCOUNTER — Ambulatory Visit: Payer: 59 | Admitting: Professional

## 2013-07-10 ENCOUNTER — Encounter (HOSPITAL_BASED_OUTPATIENT_CLINIC_OR_DEPARTMENT_OTHER): Payer: Self-pay | Admitting: Emergency Medicine

## 2013-07-10 ENCOUNTER — Emergency Department (HOSPITAL_BASED_OUTPATIENT_CLINIC_OR_DEPARTMENT_OTHER)
Admission: EM | Admit: 2013-07-10 | Discharge: 2013-07-11 | Disposition: A | Discharge status: Home / Self Care | Payer: Managed Care, Other (non HMO) | Attending: Emergency Medicine | Admitting: Emergency Medicine

## 2013-07-10 DIAGNOSIS — Z7982 Long term (current) use of aspirin: Secondary | ICD-10-CM | POA: Insufficient documentation

## 2013-07-10 DIAGNOSIS — R51 Headache: Secondary | ICD-10-CM | POA: Insufficient documentation

## 2013-07-10 DIAGNOSIS — K5732 Diverticulitis of large intestine without perforation or abscess without bleeding: Secondary | ICD-10-CM | POA: Insufficient documentation

## 2013-07-10 DIAGNOSIS — Z79899 Other long term (current) drug therapy: Secondary | ICD-10-CM | POA: Insufficient documentation

## 2013-07-10 HISTORY — DX: Diverticulitis of intestine, part unspecified, without perforation or abscess without bleeding: K57.92

## 2013-07-10 LAB — CBC
HCT: 48.7 % (ref 39.0–52.0)
Hemoglobin: 17.5 g/dL — ABNORMAL HIGH (ref 13.0–17.0)
MCHC: 35.9 g/dL (ref 30.0–36.0)
RDW: 13.7 % (ref 11.5–15.5)
WBC: 16.1 10*3/uL — ABNORMAL HIGH (ref 4.0–10.5)

## 2013-07-10 MED ORDER — MORPHINE SULFATE 4 MG/ML IJ SOLN
4.0000 mg | Freq: Once | INTRAMUSCULAR | Status: AC
  Administered 2013-07-11: 4 mg via INTRAVENOUS
  Filled 2013-07-10: qty 1

## 2013-07-10 MED ORDER — ONDANSETRON HCL 4 MG/2ML IJ SOLN
4.0000 mg | Freq: Once | INTRAMUSCULAR | Status: AC
  Administered 2013-07-11: 4 mg via INTRAVENOUS
  Filled 2013-07-10: qty 2

## 2013-07-10 MED ORDER — SODIUM CHLORIDE 0.9 % IV BOLUS (SEPSIS)
1000.0000 mL | Freq: Once | INTRAVENOUS | Status: AC
  Administered 2013-07-11: 1000 mL via INTRAVENOUS

## 2013-07-10 NOTE — ED Notes (Signed)
Pt c/o lower abd pain since 10 am. Pt also reports HA. Pt denies diarrhea.

## 2013-07-11 ENCOUNTER — Emergency Department (HOSPITAL_BASED_OUTPATIENT_CLINIC_OR_DEPARTMENT_OTHER): Payer: Managed Care, Other (non HMO)

## 2013-07-11 ENCOUNTER — Encounter (HOSPITAL_BASED_OUTPATIENT_CLINIC_OR_DEPARTMENT_OTHER): Payer: Self-pay

## 2013-07-11 LAB — COMPREHENSIVE METABOLIC PANEL
ALT: 33 U/L (ref 0–53)
Albumin: 4.6 g/dL (ref 3.5–5.2)
Alkaline Phosphatase: 74 U/L (ref 39–117)
BUN: 10 mg/dL (ref 6–23)
Chloride: 97 mEq/L (ref 96–112)
Potassium: 4.3 mEq/L (ref 3.5–5.1)
Sodium: 135 mEq/L (ref 135–145)
Total Bilirubin: 0.9 mg/dL (ref 0.3–1.2)
Total Protein: 8.3 g/dL (ref 6.0–8.3)

## 2013-07-11 LAB — URINALYSIS, ROUTINE W REFLEX MICROSCOPIC
Bilirubin Urine: NEGATIVE
Hgb urine dipstick: NEGATIVE
Nitrite: NEGATIVE
Specific Gravity, Urine: 1.015 (ref 1.005–1.030)
pH: 5.5 (ref 5.0–8.0)

## 2013-07-11 MED ORDER — IBUPROFEN 800 MG PO TABS: 800.0000 mg | ORAL_TABLET | Freq: Once | ORAL | Status: AC

## 2013-07-11 MED ORDER — CIPROFLOXACIN HCL 500 MG PO TABS
500.0000 mg | ORAL_TABLET | Freq: Once | ORAL | Status: AC
  Administered 2013-07-11: 500 mg via ORAL
  Filled 2013-07-11: qty 1

## 2013-07-11 MED ORDER — OXYCODONE-ACETAMINOPHEN 5-325 MG PO TABS: 1.0000 | ORAL_TABLET | Freq: Four times a day (QID) | ORAL | Status: DC | PRN

## 2013-07-11 MED ORDER — IOHEXOL 300 MG/ML  SOLN
50.0000 mL | Freq: Once | INTRAMUSCULAR | Status: AC | PRN
  Administered 2013-07-11: 50 mL via ORAL

## 2013-07-11 MED ORDER — METRONIDAZOLE 500 MG PO TABS: 500.0000 mg | ORAL_TABLET | Freq: Three times a day (TID) | ORAL | Status: DC

## 2013-07-11 MED ORDER — IBUPROFEN 800 MG PO TABS
ORAL_TABLET | ORAL | Status: AC
  Administered 2013-07-11: 800 mg via ORAL
  Filled 2013-07-11: qty 1

## 2013-07-11 MED ORDER — ACETAMINOPHEN 500 MG PO TABS
ORAL_TABLET | ORAL | Status: AC
  Filled 2013-07-11: qty 2

## 2013-07-11 MED ORDER — IOHEXOL 300 MG/ML  SOLN
100.0000 mL | Freq: Once | INTRAMUSCULAR | Status: AC | PRN
  Administered 2013-07-11: 100 mL via INTRAVENOUS

## 2013-07-11 MED ORDER — ONDANSETRON 8 MG PO TBDP: ORAL_TABLET | ORAL | Status: DC

## 2013-07-11 MED ORDER — CIPROFLOXACIN HCL 500 MG PO TABS: 500.0000 mg | ORAL_TABLET | Freq: Two times a day (BID) | ORAL | Status: DC

## 2013-07-11 MED ORDER — SODIUM CHLORIDE 0.9 % IV SOLN
3.0000 g | Freq: Once | INTRAVENOUS | Status: AC
  Administered 2013-07-11: 3 g via INTRAVENOUS
  Filled 2013-07-11: qty 3

## 2013-07-11 MED ORDER — METRONIDAZOLE 500 MG PO TABS
500.0000 mg | ORAL_TABLET | Freq: Once | ORAL | Status: AC
  Administered 2013-07-11: 500 mg via ORAL
  Filled 2013-07-11: qty 1

## 2013-07-11 NOTE — ED Notes (Signed)
Patient transported to CT 

## 2013-07-11 NOTE — ED Notes (Signed)
Pt given additional warm blanket and water per request.  

## 2013-07-11 NOTE — ED Notes (Signed)
Pt having chills and still c/o HA. Pt states abd pain is "OK". Pt temp 99.2 oral. Pt offered tylenol but refused stating, "I don't like the way Tylenol makes me feel." MD made aware. Pt agreeable to taking ibuprofen after some encouragement.

## 2013-07-11 NOTE — ED Provider Notes (Signed)
CSN: [id number]     Arrival date & time 07/10/13  2328 History     First MD Initiated Contact with Patient 07/10/13 2338     Chief Complaint  Patient presents with  . Abdominal Pain  . Headache   (Consider location/radiation/quality/duration/timing/severity/associated sxs/prior Treatment) Patient is a 47 y.o. male presenting with abdominal pain and headaches. The history is provided by the patient.  Abdominal Pain Pain location:  Suprapubic Pain quality: sharp   Pain radiates to:  Does not radiate Pain severity:  Severe Onset quality:  Gradual Timing:  Constant Progression:  Unchanged Chronicity:  Recurrent Context: not recent travel and not trauma   Relieved by:  Nothing Worsened by:  Nothing tried Ineffective treatments:  None tried Associated symptoms: no anorexia, no constipation, no diarrhea, no dysuria, no nausea and no vomiting   Risk factors: no alcohol abuse   Headache Associated symptoms: abdominal pain   Associated symptoms: no diarrhea, no nausea and no vomiting   States this feels like his previous diverticulitis, he had a frontal HA with that as well  Past Medical History  Diagnosis Date  . Diverticulitis    History reviewed. No pertinent past surgical history. No family history on file. History  Substance Use Topics  . Smoking status: Never Smoker   . Smokeless tobacco: Not on file  . Alcohol Use: Yes    Review of Systems  Gastrointestinal: Positive for abdominal pain. Negative for nausea, vomiting, diarrhea, constipation, blood in stool and anorexia.  Genitourinary: Negative for dysuria.  Neurological: Positive for headaches.  All other systems reviewed and are negative.    Allergies  Niacin  Home Medications   Current Outpatient Rx  Name  Route  Sig  Dispense  Refill  . aspirin 325 MG tablet   Oral   Take 325 mg by mouth daily.         Marland Kitchen ibuprofen (ADVIL,MOTRIN) 400 MG tablet   Oral   Take 400 mg by mouth every 6 (six) hours as  needed for pain.         Marland Kitchen MELATONIN ER PO   Oral   Take by mouth.          BP 153/103  Pulse 116  Temp(Src) 99.2 F (37.3 C) (Oral)  Resp 18  Ht 6\' 2"  (1.88 m)  Wt 298 lb (135.172 kg)  BMI 38.24 kg/m2  SpO2 98% Physical Exam  Constitutional: He is oriented to person, place, and time. He appears well-developed and well-nourished. No distress.  HENT:  Head: Normocephalic and atraumatic.  Mouth/Throat: Oropharynx is clear and moist.  Eyes: Conjunctivae are normal. Pupils are equal, round, and reactive to light.  Neck: Normal range of motion. Neck supple.  Cardiovascular: Normal rate and regular rhythm.   Pulmonary/Chest: Effort normal and breath sounds normal. He has no wheezes. He has no rales.  Abdominal: Soft. Bowel sounds are normal. He exhibits no mass. There is no tenderness. There is no rebound and no guarding.  Musculoskeletal: Normal range of motion.  Neurological: He is alert and oriented to person, place, and time.  Skin: Skin is warm and dry.  Psychiatric: He has a normal mood and affect.    ED Course   Procedures (including critical care time)  Labs Reviewed  CBC - Abnormal; Notable for the following:    WBC 16.1 (*)    RBC 5.89 (*)    Hemoglobin 17.5 (*)    All other components within normal limits  COMPREHENSIVE METABOLIC PANEL -  Abnormal; Notable for the following:    Glucose, Bld 161 (*)    GFR calc non Af Amer 87 (*)    All other components within normal limits  URINALYSIS, ROUTINE W REFLEX MICROSCOPIC   Ct Abdomen Pelvis W Contrast  07/11/2013   *RADIOLOGY REPORT*  Clinical Data: Abdominal pain and elevated white blood cell count. History of diverticulitis.  CT ABDOMEN AND PELVIS WITH CONTRAST  Technique:  Multidetector CT imaging of the abdomen and pelvis was performed following the standard protocol during bolus administration of intravenous contrast.  Contrast: 50mL OMNIPAQUE IOHEXOL 300 MG/ML  SOLN, OMNIPAQUE IOHEXOL 300 MG/ML  SOLN   Comparison: None.  Findings: There is some dependent atelectasis in the lung bases. No pleural or pericardial effusion.  A 2.4 cm in diameter low attenuating lesion in the spleen is compatible with a cyst or hemangioma.  The liver is low attenuating consistent with fatty infiltration.  No focal liver lesion is seen. The gallbladder, adrenal glands, pancreas and right kidney appear normal.  Two low attenuating lesions in the left kidney are most consistent with cysts.  The patient has colonic diverticular disease.  There is inflammatory change in the mid sigmoid colon with wall thickening consistent with superimposed acute diverticulitis.  No abscess or perforation is seen.  The colon is otherwise unremarkable. Stomach, small bowel and appendix appear normal.  No lymphadenopathy is identified.  No focal bony abnormality is seen with degenerative disease noted in the lower lumbar spine.  IMPRESSION:  1.  Study is positive for acute sigmoid diverticulitis without abscess or perforation.  2.  Fatty infiltration of the liver.  3.  Small splenic cyst versus hemangioma.   Original Report Authenticated By: Holley Dexter, M.D.   No diagnosis found.  MDM  Diverticulitis.  Patient able to take PO.  Patient is safe for discharge on antibiotics at this time.  Retuirn for intractable vomiting, worsening pain, persistent fevers or any concerns.    Jasmine Awe, MD 07/11/13 [phone]

## 2013-07-12 ENCOUNTER — Encounter: Payer: Self-pay | Admitting: Internal Medicine

## 2013-07-12 ENCOUNTER — Encounter: Payer: Self-pay | Admitting: Gastroenterology

## 2013-07-12 ENCOUNTER — Ambulatory Visit (INDEPENDENT_AMBULATORY_CARE_PROVIDER_SITE_OTHER): Payer: Managed Care, Other (non HMO) | Admitting: Internal Medicine

## 2013-07-12 VITALS — BP 110/80 | HR 96 | Temp 99.2°F | Ht 74.0 in | Wt 290.0 lb

## 2013-07-12 DIAGNOSIS — R51 Headache: Secondary | ICD-10-CM

## 2013-07-12 DIAGNOSIS — Z860100 Personal history of colon polyps, unspecified: Secondary | ICD-10-CM

## 2013-07-12 DIAGNOSIS — F3289 Other specified depressive episodes: Secondary | ICD-10-CM

## 2013-07-12 DIAGNOSIS — R519 Headache, unspecified: Secondary | ICD-10-CM | POA: Insufficient documentation

## 2013-07-12 DIAGNOSIS — K5792 Diverticulitis of intestine, part unspecified, without perforation or abscess without bleeding: Secondary | ICD-10-CM

## 2013-07-12 DIAGNOSIS — Z0001 Encounter for general adult medical examination with abnormal findings: Secondary | ICD-10-CM | POA: Insufficient documentation

## 2013-07-12 DIAGNOSIS — Z0000 Encounter for general adult medical examination without abnormal findings: Secondary | ICD-10-CM

## 2013-07-12 DIAGNOSIS — F329 Major depressive disorder, single episode, unspecified: Secondary | ICD-10-CM

## 2013-07-12 DIAGNOSIS — K5732 Diverticulitis of large intestine without perforation or abscess without bleeding: Secondary | ICD-10-CM

## 2013-07-12 DIAGNOSIS — F411 Generalized anxiety disorder: Secondary | ICD-10-CM

## 2013-07-12 HISTORY — DX: Encounter for general adult medical examination with abnormal findings: Z00.01

## 2013-07-12 MED ORDER — ALPRAZOLAM 0.5 MG PO TBDP: 0.5000 mg | ORAL_TABLET | Freq: Two times a day (BID) | ORAL | Status: DC | PRN

## 2013-07-12 MED ORDER — OXYCODONE HCL 5 MG PO TABA: 1.0000 | ORAL_TABLET | Freq: Four times a day (QID) | ORAL | Status: DC | PRN

## 2013-07-12 NOTE — Assessment & Plan Note (Signed)
For colonscopy as he is due

## 2013-07-12 NOTE — Assessment & Plan Note (Signed)
To finish antibx, overall clinically improved, change percocet to oxycodone per pt request,  to f/u any worsening symptoms or concerns

## 2013-07-12 NOTE — Progress Notes (Signed)
  Subjective:    Patient ID: William [name], male    DOB: [birth year], 47 y.o.   MRN: [id number]  HPI  Here to f/u recent sigmoid diverticulitis by CT with tx per UC 3 days ago.  Overall doing some better, but still some soreness/pain, HA, general weakness and malaise, though no worsening fever, chills, blood, n/v.  Is currently overdue for colonoscopy.  Denies worsening depressive symptoms but is being tx for depression with counseling only, no suicidal ideation, or panic but has ongoing anxiety, more tense and increased recently, better with one of wife;s xanax.  Has not taken any of the percocet as he did not want the acetominophen. Pt denies chest pain, increased sob or doe, wheezing, orthopnea, PND, increased LE swelling, palpitations, dizziness or syncope.   Pt denies polydipsia, polyuria. Past Medical History  Diagnosis Date  . Diverticulitis    No past surgical history on file.  reports that he has never smoked. He does not have any smokeless tobacco history on file. He reports that  drinks alcohol. He reports that he does not use illicit drugs. family history is not on file. Allergies  Allergen Reactions  . Niacin    Current Outpatient Prescriptions on File Prior to Visit  Medication Sig Dispense Refill  . aspirin 325 MG tablet Take 325 mg by mouth daily.      . ciprofloxacin (CIPRO) 500 MG tablet Take 1 tablet (500 mg total) by mouth 2 (two) times daily.  20 tablet  0  . ibuprofen (ADVIL,MOTRIN) 400 MG tablet Take 400 mg by mouth every 6 (six) hours as needed for pain.      Marland Kitchen MELATONIN ER PO Take by mouth.      . metroNIDAZOLE (FLAGYL) 500 MG tablet Take 1 tablet (500 mg total) by mouth 3 (three) times daily.  30 tablet  0  . ondansetron (ZOFRAN ODT) 8 MG disintegrating tablet 8mg  ODT q8 hours prn nausea  12 tablet  0   No current facility-administered medications on file prior to visit.   Review of Systems  Constitutional: Negative for unexpected weight change, or unusual  diaphoresis  HENT: Negative for tinnitus.   Eyes: Negative for photophobia and visual disturbance.  Respiratory: Negative for choking and stridor.   Gastrointestinal: Negative for vomiting and blood in stool.  Genitourinary: Negative for hematuria and decreased urine volume.  Musculoskeletal: Negative for acute joint swelling Skin: Negative for color change and wound.  Neurological: Negative for tremors and numbness other than noted  Psychiatric/Behavioral: Negative for decreased concentration or  hyperactivity.       Objective:   Physical Exam BP 110/80  Pulse 96  Temp(Src) 99.2 F (37.3 C) (Oral)  Ht 6\' 2"  (1.88 m)  Wt 290 lb (131.543 kg)  BMI 37.22 kg/m2  SpO2 96% VS noted, not ill appaering Constitutional: Pt appears well-developed and well-nourished.  HENT: Head: NCAT.  Right Ear: External ear normal.  Left Ear: External ear normal.  Eyes: Conjunctivae and EOM are normal. Pupils are equal, round, and reactive to light.  Neck: Normal range of motion. Neck supple.  Cardiovascular: Normal rate and regular rhythm.   Pulmonary/Chest: Effort normal and breath sounds normal.  Abd:  Soft, NT, non-distended, + BS except for mild LLQ tender Neurological: Pt is alert. Not confused , motor 5/5, cn 2-12 intact Skin: Skin is warm. No erythema.  Psychiatric: Pt behavior is normal. Thought content normal. + depressed affect, 1-2+ nervous    Assessment & Plan:

## 2013-07-12 NOTE — Assessment & Plan Note (Signed)
Declines tx such as ssri, to cont counseling

## 2013-07-12 NOTE — Assessment & Plan Note (Signed)
Exam bening, likely related to stress or current illness,  to f/u any worsening symptoms or concerns

## 2013-07-12 NOTE — Assessment & Plan Note (Signed)
For xanax prn,  to f/u any worsening symptoms or concerns 

## 2013-07-12 NOTE — Patient Instructions (Signed)
OK to finish your antibiotics as you have Please take all new medication as prescribed - the alprazolam for nerves as needed, and the oxycodone for pain Please continue your counseling as well as you have planned  You will be contacted regarding the referral for: colonoscopy with Dr Arlyce Dice   Please return in 6 months, or sooner if needed, with Lab testing done 3-5 days before

## 2013-07-30 ENCOUNTER — Ambulatory Visit (AMBULATORY_SURGERY_CENTER): Payer: Managed Care, Other (non HMO)

## 2013-07-30 VITALS — Ht 74.0 in | Wt 287.4 lb

## 2013-07-30 DIAGNOSIS — Z860100 Personal history of colon polyps, unspecified: Secondary | ICD-10-CM

## 2013-07-30 MED ORDER — SUPREP BOWEL PREP KIT 17.5-3.13-1.6 GM/177ML PO SOLN: 1.0000 | Freq: Once | ORAL | Status: DC

## 2013-07-31 ENCOUNTER — Encounter: Payer: Self-pay | Admitting: Gastroenterology

## 2013-08-14 ENCOUNTER — Ambulatory Visit (AMBULATORY_SURGERY_CENTER): Payer: Managed Care, Other (non HMO) | Admitting: Gastroenterology

## 2013-08-14 ENCOUNTER — Encounter: Payer: Self-pay | Admitting: Gastroenterology

## 2013-08-14 VITALS — BP 127/84 | HR 66 | Temp 99.0°F | Resp 15 | Ht 74.0 in | Wt 287.0 lb

## 2013-08-14 DIAGNOSIS — K5730 Diverticulosis of large intestine without perforation or abscess without bleeding: Secondary | ICD-10-CM

## 2013-08-14 DIAGNOSIS — D126 Benign neoplasm of colon, unspecified: Secondary | ICD-10-CM

## 2013-08-14 DIAGNOSIS — Z86010 Personal history of colonic polyps: Secondary | ICD-10-CM

## 2013-08-14 MED ORDER — SODIUM CHLORIDE 0.9 % IV SOLN: 500.0000 mL | INTRAVENOUS | Status: DC

## 2013-08-14 NOTE — Patient Instructions (Addendum)
YOU HAD AN ENDOSCOPIC PROCEDURE TODAY AT THE [facility]: Refer to the procedure report that was given to you for any specific questions about what was found during the examination.  If the procedure report does not answer your questions, please call your gastroenterologist to clarify.  If you requested that your care partner not be given the details of your procedure findings, then the procedure report has been included in a sealed envelope for you to review at your convenience later.  YOU SHOULD EXPECT: Some feelings of bloating in the abdomen. Passage of more gas than usual.  Walking can help get rid of the air that was put into your GI tract during the procedure and reduce the bloating. If you had a lower endoscopy (such as a colonoscopy or flexible sigmoidoscopy) you may notice spotting of blood in your stool or on the toilet paper. If you underwent a bowel prep for your procedure, then you may not have a normal bowel movement for a few days.  DIET: Your first meal following the procedure should be a light meal and then it is ok to progress to your normal diet.  A half-sandwich or bowl of soup is an example of a good first meal.  Heavy or fried foods are harder to digest and may make you feel nauseous or bloated.  Likewise meals heavy in dairy and vegetables can cause extra gas to form and this can also increase the bloating.  Drink plenty of fluids but you should avoid alcoholic beverages for 24 hours.  ACTIVITY: Your care partner should take you home directly after the procedure.  You should plan to take it easy, moving slowly for the rest of the day.  You can resume normal activity the day after the procedure however you should NOT DRIVE or use heavy machinery for 24 hours (because of the sedation medicines used during the test).    SYMPTOMS TO REPORT IMMEDIATELY: A gastroenterologist can be reached at any hour.  During normal business hours, 8:30 AM to 5:00 PM Monday through Friday,  call [phone].  After hours and on weekends, please call the GI answering service at [phone] who will take a message and have the physician on call contact you.   Following lower endoscopy (colonoscopy or flexible sigmoidoscopy):  Excessive amounts of blood in the stool  Significant tenderness or worsening of abdominal pains  Swelling of the abdomen that is new, acute  Fever of 100F or higher    FOLLOW UP: If any biopsies were taken you will be contacted by phone or by letter within the next 1-3 weeks.  Call your gastroenterologist if you have not heard about the biopsies in 3 weeks.  Our staff will call the home number listed on your records the next business day following your procedure to check on you and address any questions or concerns that you may have at that time regarding the information given to you following your procedure. This is a courtesy call and so if there is no answer at the home number and we have not heard from you through the emergency physician on call, we will assume that you have returned to your regular daily activities without incident.  SIGNATURES/CONFIDENTIALITY: You and/or your care partner have signed paperwork which will be entered into your electronic medical record.  These signatures attest to the fact that that the information above on your After Visit Summary has been reviewed and is understood.  Full responsibility of the confidentiality   of this discharge information lies with you and/or your care-partner.   Handouts were given to your care partner on polyps, diverticulosis and a high fiber diet. You may resume your current medications today. Please call if any questions or concerns.    

## 2013-08-14 NOTE — Op Note (Signed)
[facility] [address] [place], [address]   COLONOSCOPY PROCEDURE REPORT  PATIENT: William [name], William [name]  MR#: [id number] BIRTHDATE: 1966/04/17 , 47  yrs. old GENDER: Male ENDOSCOPIST: Louis Meckel, MD REFERRED BY: PROCEDURE DATE:  08/14/2013 PROCEDURE:   Colonoscopy with snare polypectomy and Colonoscopy with cold biopsy polypectomy First Screening Colonoscopy - Avg.  risk and is 50 yrs.  old or older - No.  Prior Negative Screening - Now for repeat screening. N/A  History of Adenoma - Now for follow-up colonoscopy & has been > or = to 3 yrs.  Yes hx of adenoma.  Has been 3 or more years since last colonoscopy.  Polyps Removed Today? Yes. ASA CLASS:   Class II INDICATIONS:Patient's personal history of adenomatous colon polyps 2004; 2009 colo negative for polyps MEDICATIONS: MAC sedation, administered by CRNA and propofol (Diprivan) 500mg  IV  DESCRIPTION OF PROCEDURE:   After the risks benefits and alternatives of the procedure were thoroughly explained, informed consent was obtained.  A digital rectal exam revealed no abnormalities of the rectum.   The LB [id number]  endoscope was introduced through the anus and advanced to the cecum, which was identified by both the appendix and ileocecal valve. No adverse events experienced.   The quality of the prep was Suprep good  The instrument was then slowly withdrawn as the colon was fully examined.      COLON FINDINGS: A sessile polyp measuring 12 mm in size was found in the distal descending colon.  A polypectomy was performed with a cold snare.  The resection was complete and the polyp tissue was completely retrieved.   A sessile polyp measuring 2 mm in size was found in the distal descending colon.  A polypectomy was performed with cold forceps.   Moderate diverticulosis was noted in the sigmoid colon and descending colon.   There was scattered diverticulosis noted in the transverse colon and  ascending colon. The colon mucosa was otherwise normal.  Retroflexed views revealed no abnormalities. The time to cecum=5 minutes 31 seconds. Withdrawal time=17 minutes 03 seconds.  The scope was withdrawn and the procedure completed. COMPLICATIONS: There were no complications.  ENDOSCOPIC IMPRESSION: 1.   Sessile polyp measuring 12 mm in size was found in the distal descending colon; polypectomy was performed with a cold snare 2.   Sessile polyp measuring 2 mm in size was found in the distal descending colon; polypectomy was performed with cold forceps 3.   Moderate diverticulosis was noted in the sigmoid colon and descending colon 4.   There was diverticulosis noted in the transverse colon and ascending colon 5.   The colon mucosa was otherwise normal  RECOMMENDATIONS: Colonoscopy in 3 years if adenoma, in view of size of polyp   eSigned:  Louis Meckel, MD 08/14/2013 2:34 PM   cc: Corwin Levins, MD and Zelphia Cairo MD   PATIENT NAME:  Labib, Cwynar MR#: [id number]

## 2013-08-14 NOTE — Progress Notes (Signed)
Called to room to assist during endoscopic procedure.  Patient ID and intended procedure confirmed with present staff. Received instructions for my participation in the procedure from the performing physician.  

## 2013-08-14 NOTE — Progress Notes (Signed)
No complaints noted in the recovery room. Maw   

## 2013-08-15 ENCOUNTER — Encounter: Payer: Self-pay | Admitting: *Deleted

## 2013-08-15 ENCOUNTER — Telehealth: Payer: Self-pay | Admitting: *Deleted

## 2013-08-15 NOTE — Telephone Encounter (Signed)
  Follow up Call-  Call back number 08/14/2013  Post procedure Call Back phone  # [phone]  Permission to leave phone message Yes     Patient questions:  Do you have a fever, pain , or abdominal swelling? no Pain Score  0 *  Have you tolerated food without any problems? yes  Have you been able to return to your normal activities? yes  Do you have any questions about your discharge instructions: Diet   no Medications  no Follow up visit  no  Do you have questions or concerns about your Care? no  Actions: * If pain score is 4 or above: No action needed, pain <4.

## 2013-08-15 NOTE — Telephone Encounter (Signed)
Erroneous request

## 2013-08-22 ENCOUNTER — Encounter: Payer: Self-pay | Admitting: Gastroenterology

## 2013-09-24 ENCOUNTER — Encounter: Payer: Self-pay | Admitting: Internal Medicine

## 2013-09-24 ENCOUNTER — Ambulatory Visit (INDEPENDENT_AMBULATORY_CARE_PROVIDER_SITE_OTHER): Payer: Managed Care, Other (non HMO) | Admitting: Internal Medicine

## 2013-09-24 VITALS — BP 124/80 | HR 98 | Temp 99.0°F | Ht 74.0 in | Wt 280.1 lb

## 2013-09-24 DIAGNOSIS — I10 Essential (primary) hypertension: Secondary | ICD-10-CM

## 2013-09-24 DIAGNOSIS — R1031 Right lower quadrant pain: Secondary | ICD-10-CM

## 2013-09-24 MED ORDER — METRONIDAZOLE 250 MG PO TABS: 250.0000 mg | ORAL_TABLET | Freq: Three times a day (TID) | ORAL | Status: DC

## 2013-09-24 MED ORDER — CIPROFLOXACIN HCL 500 MG PO TABS: 500.0000 mg | ORAL_TABLET | Freq: Two times a day (BID) | ORAL | Status: DC

## 2013-09-24 NOTE — Patient Instructions (Signed)
Please take all new medication as prescribed - the antibiotic  Please call in 1 -2 days if not improved or getting worse pain, as you should have the CT done  Please continue all other medications as before, and refills have been done if requested.  Please have the pharmacy call with any other refills you may need.

## 2013-09-24 NOTE — Assessment & Plan Note (Signed)
rlq and low mid abd, not c/w recent sigmoid diverticulitis, etiology unclear though he states pain quite similar as to aug 2014 episode, I cant r/o appendicitis given the location but he is adamant he will consent to repeat CT as he had hives with dye in august, declines labs, will tx empirically with cipro/flagyl but at any time he changes his mind would consider repeat CT ad labs

## 2013-09-24 NOTE — Progress Notes (Signed)
  Subjective:    Patient ID: William [name], male    DOB: [birth year], 47 y.o.   MRN: [id number]  HPI 47 yo WM here with c/o symptoms quite similar to recent aug 2014 documented acute sigmoid diverticulitis with low mid abd sharp pain worst 2-3 days ago, actually somewhat less today, but with feverish  And Denies urinary symptoms such as dysuria, frequency, urgency, flank pain, hematuria or n/v, fever, chills.  Denies worsening reflux, other abd pain, dysphagia, n/v, bowel change or blood. No radiation, n/v, but with reduced appetite.  Has had marked increased stress prior to onset pain.  No wt loss, joint pain, blood. Pt denies chest pain, increased sob or doe, wheezing, orthopnea, PND, increased LE swelling, palpitations, dizziness or syncope.  Pt denies polydipsia, polyuria,  Past Medical History  Diagnosis Date  . Diverticulitis    Past Surgical History  Procedure Laterality Date  . Colonoscopy      reports that he has never smoked. He has never used smokeless tobacco. He reports that he drinks alcohol. He reports that he does not use illicit drugs. family history is negative for Colon cancer. Allergies  Allergen Reactions  . Ivp Dye [Iodinated Diagnostic Agents] Hives    Per pt after CT with IV contrast aug 2014  . Niacin    Current Outpatient Prescriptions on File Prior to Visit  Medication Sig Dispense Refill  . ALPRAZolam (NIRAVAM) 0.5 MG dissolvable tablet Take 1 tablet (0.5 mg total) by mouth 2 (two) times daily as needed for anxiety.  60 tablet  0  . aspirin 325 MG tablet Take 325 mg by mouth daily.      Marland Kitchen ibuprofen (ADVIL,MOTRIN) 400 MG tablet Take 400 mg by mouth every 6 (six) hours as needed for pain.      Marland Kitchen MELATONIN ER PO Take by mouth.       No current facility-administered medications on file prior to visit.   Review of Systems  Constitutional: Negative for unexpected weight change, or unusual diaphoresis  HENT: Negative for tinnitus.   Eyes: Negative for photophobia  and visual disturbance.  Respiratory: Negative for choking and stridor.   Gastrointestinal: Negative for vomiting and blood in stool.  Genitourinary: Negative for hematuria and decreased urine volume.  Musculoskeletal: Negative for acute joint swelling Skin: Negative for color change and wound.  Neurological: Negative for tremors and numbness other than noted  Psychiatric/Behavioral: Negative for decreased concentration or  hyperactivity.       Objective:   Physical Exam BP 124/80  Pulse 98  Temp(Src) 99 F (37.2 C) (Oral)  Ht 6\' 2"  (1.88 m)  Wt 280 lb 2 oz (127.064 kg)  BMI 35.95 kg/m2  SpO2 96% VS noted,  Constitutional: Pt appears well-developed and well-nourished.  HENT: Head: NCAT.  Right Ear: External ear normal.  Left Ear: External ear normal.  Eyes: Conjunctivae and EOM are normal. Pupils are equal, round, and reactive to light.  Neck: Normal range of motion. Neck supple.  Cardiovascular: Normal rate and regular rhythm.   Pulmonary/Chest: Effort normal and breath sounds normal.  Abd:  Soft, non-distended, + BS but with mid and rlq tender, mild to mod, no guarding/rebound Neurological: Pt is alert. Not confused  Skin: Skin is warm. No erythema.  Psychiatric: Pt behavior is normal. Thought content normal. mild nervous    Assessment & Plan:

## 2013-09-24 NOTE — Assessment & Plan Note (Signed)
stable overall by history and exam, recent data reviewed with pt, and pt to continue medical treatment as before,  to f/u any worsening symptoms or concerns BP Readings from Last 3 Encounters:  09/24/13 124/80  08/14/13 127/84  07/12/13 110/80

## 2013-11-14 IMAGING — CT CT ABD-PELV W/ CM
2 of 5 series · 17 of 46 positions shown, 19 images · IV contrast (APPLIED)
Comparison: None.

CLINICAL DATA: Abdominal pain and elevated white blood cell count.
History of diverticulitis.

CT ABDOMEN AND PELVIS WITH CONTRAST
TECHNIQUE: Multidetector CT imaging of the abdomen and pelvis was
performed following the standard protocol during bolus
administration of intravenous contrast.
Contrast: 50mL OMNIPAQUE IOHEXOL 300 MG/ML  SOLN, 100mL OMNIPAQUE
IOHEXOL 300 MG/ML  SOLN

[Series 2: abd/pelvis 5.0 b31f · axial · 0.98mm/px · z∈[-525,-25]mm · 14 of 114 slices shown, 16 images]
[im 7/114  soft-tissue]
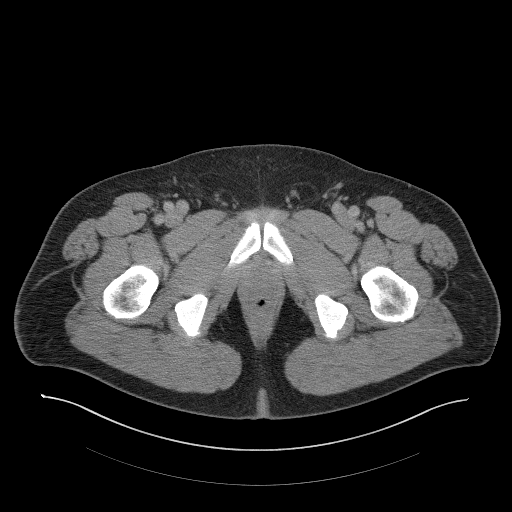
[im 7/114  bone]
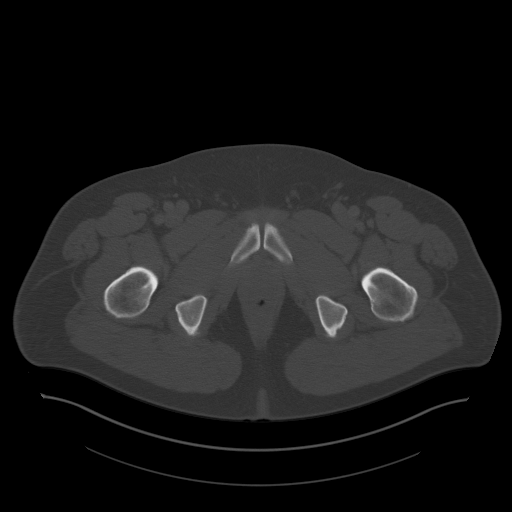
[im 13/114  soft-tissue]
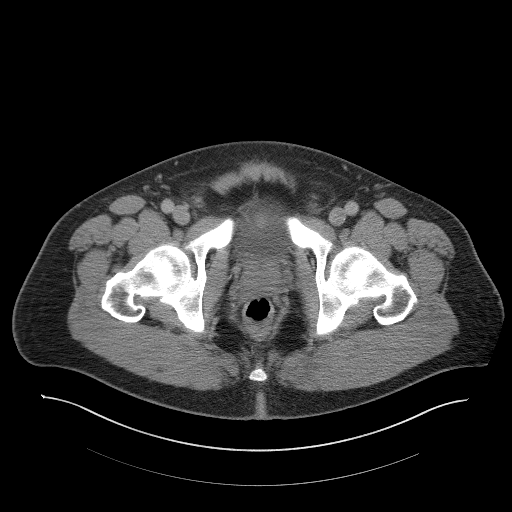
[im 26/114  soft-tissue]
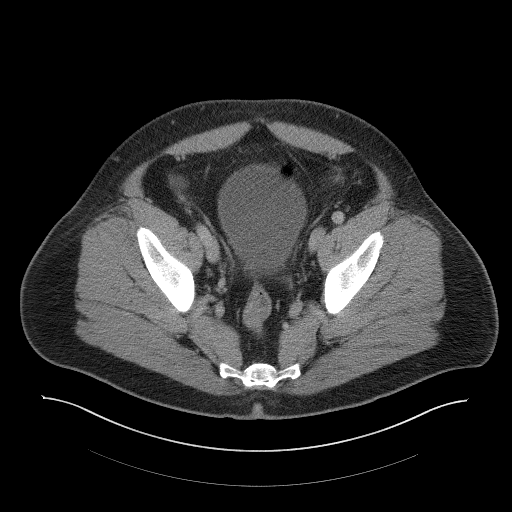
[im 32/114  soft-tissue]
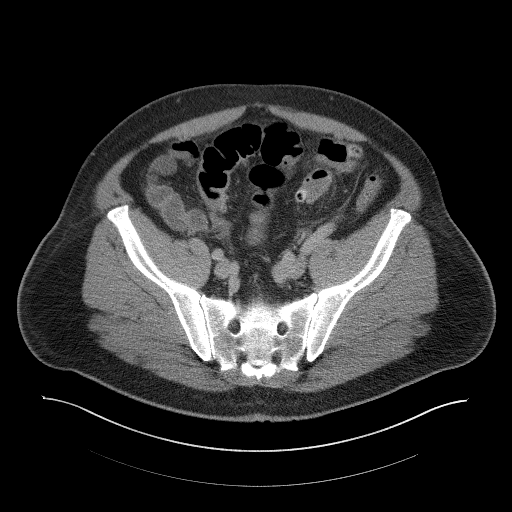
[im 38/114  soft-tissue]
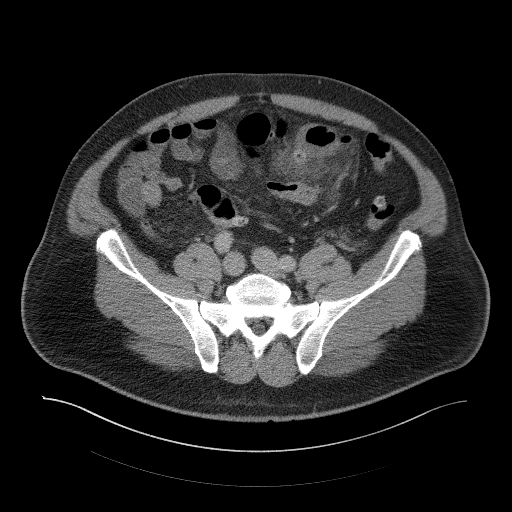
[im 44/114  soft-tissue]
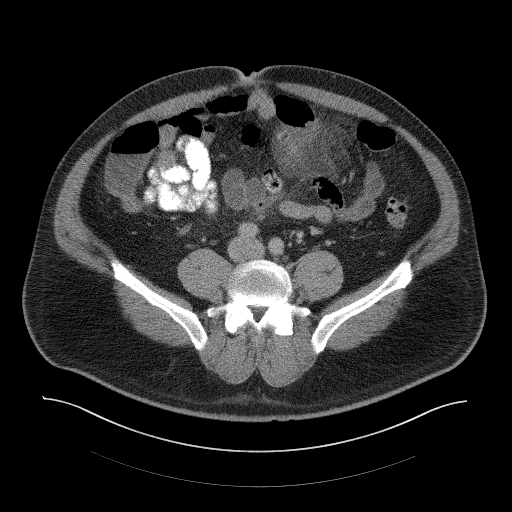
[im 51/114  soft-tissue]
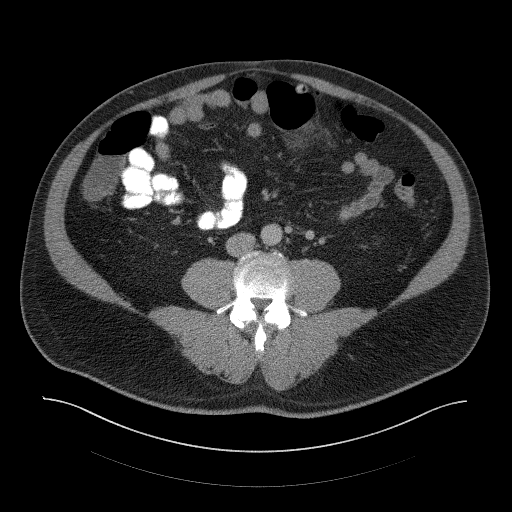
[im 63/114  soft-tissue]
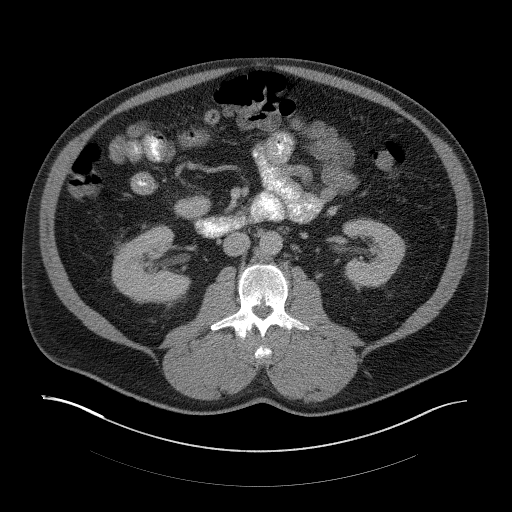
[im 70/114  soft-tissue]
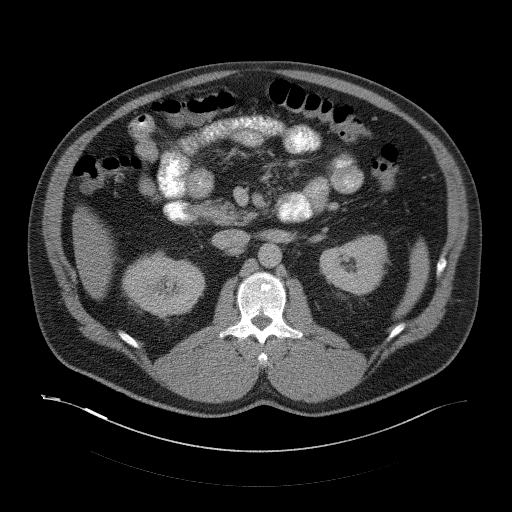
[im 70/114  bone]
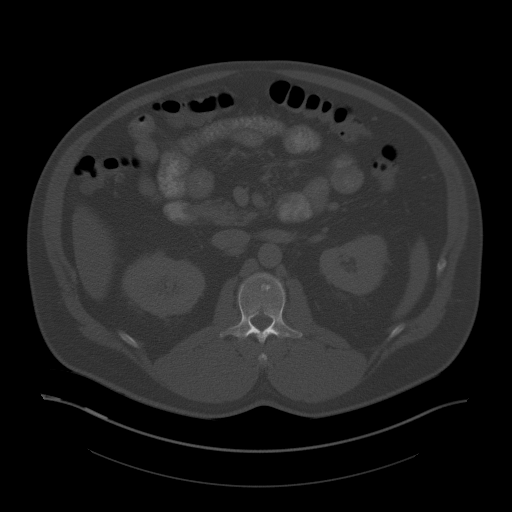
[im 76/114  soft-tissue]
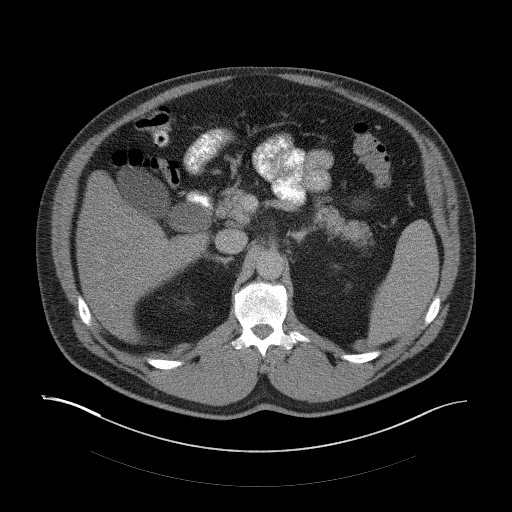
[im 82/114  soft-tissue]
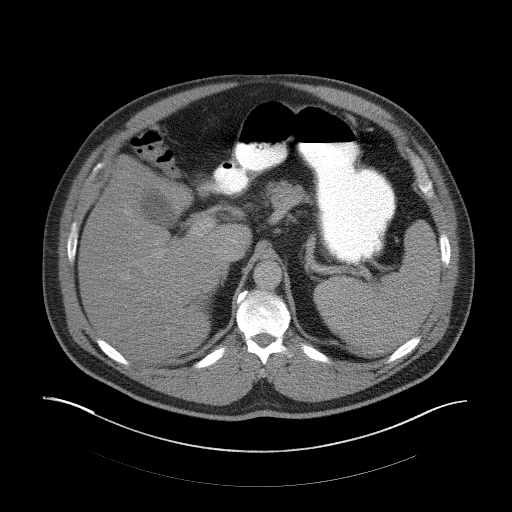
[im 88/114  soft-tissue]
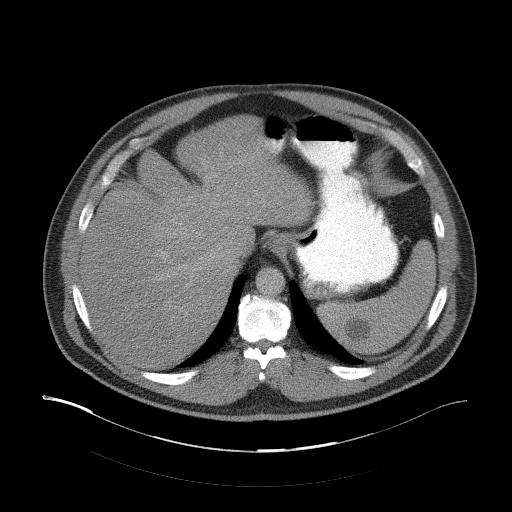
[im 101/114  soft-tissue]
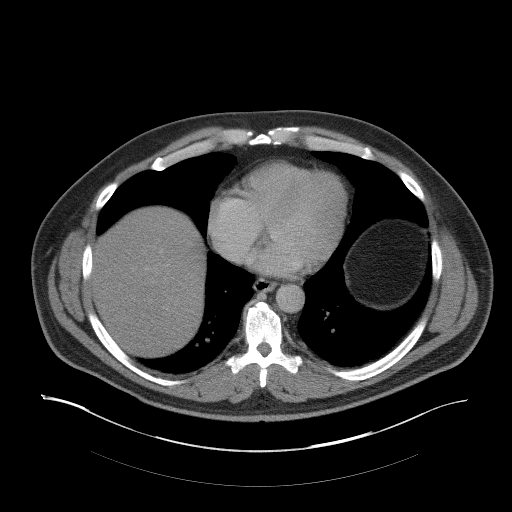
[im 107/114  soft-tissue]
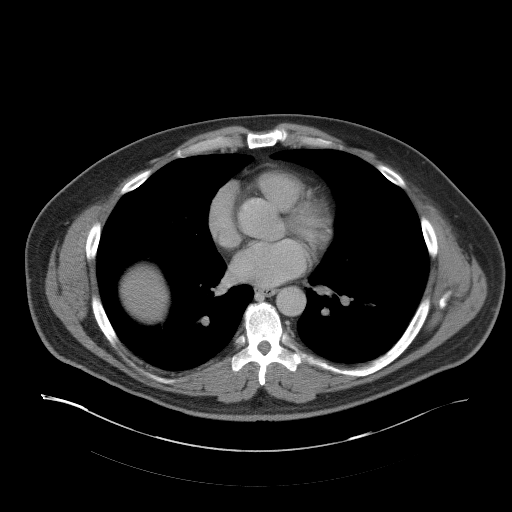

[Series 5: abd/pelvis 3.0 coronal · coronal · 1.20mm/px · 3 of 112 slices shown]
[im 38/112  soft-tissue]
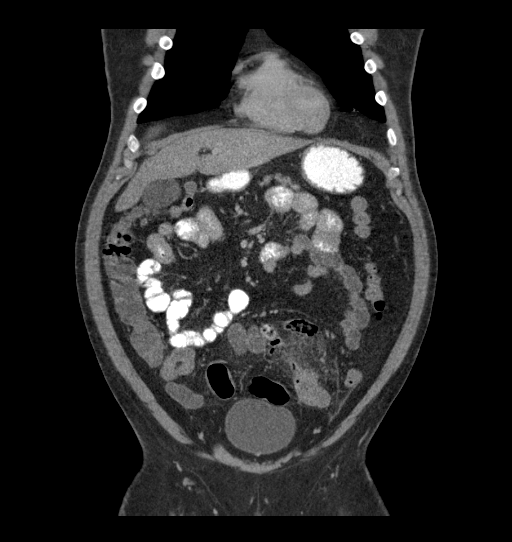
[im 50/112  soft-tissue]
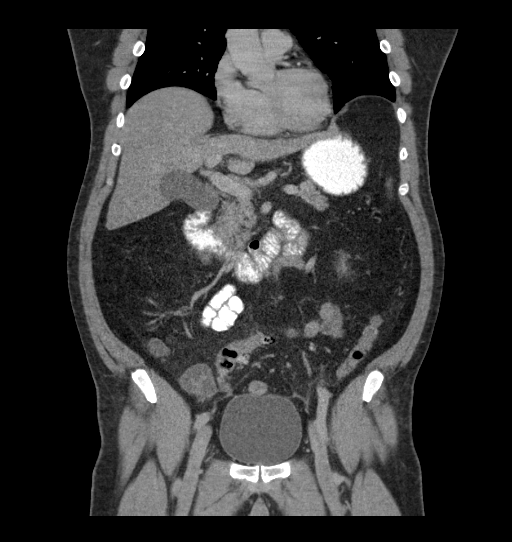
[im 62/112  soft-tissue]
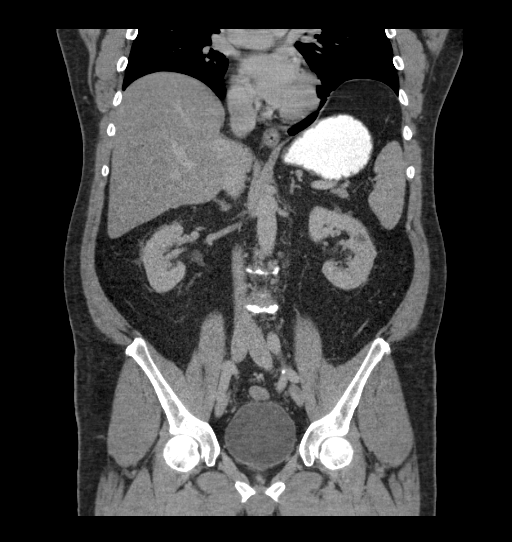

[17 of 46 positions shown; findings below may reference images not displayed]

FINDINGS: There is some dependent atelectasis in the lung bases.
No pleural or pericardial effusion.

A 2.4 cm in diameter low attenuating lesion in the spleen is
compatible with a cyst or hemangioma.  The liver is low attenuating
consistent with fatty infiltration.  No focal liver lesion is seen.
The gallbladder, adrenal glands, pancreas and right kidney appear
normal.  Two low attenuating lesions in the left kidney are most
consistent with cysts.

The patient has colonic diverticular disease.  There is
inflammatory change in the mid sigmoid colon with wall thickening
consistent with superimposed acute diverticulitis.  No abscess or
perforation is seen.  The colon is otherwise unremarkable.
Stomach, small bowel and appendix appear normal.  No
lymphadenopathy is identified.  No focal bony abnormality is seen
with degenerative disease noted in the lower lumbar spine.
IMPRESSION: 1.  Study is positive for acute sigmoid diverticulitis without
abscess or perforation.

2.  Fatty infiltration of the liver.

3.  Small splenic cyst versus hemangioma.

## 2013-12-12 ENCOUNTER — Encounter: Payer: Self-pay | Admitting: Internal Medicine

## 2013-12-12 ENCOUNTER — Ambulatory Visit (INDEPENDENT_AMBULATORY_CARE_PROVIDER_SITE_OTHER): Payer: Managed Care, Other (non HMO) | Admitting: Internal Medicine

## 2013-12-12 ENCOUNTER — Other Ambulatory Visit (INDEPENDENT_AMBULATORY_CARE_PROVIDER_SITE_OTHER): Payer: Managed Care, Other (non HMO)

## 2013-12-12 VITALS — BP 136/74 | HR 77 | Temp 97.1°F | Resp 16 | Wt 291.0 lb

## 2013-12-12 DIAGNOSIS — M47816 Spondylosis without myelopathy or radiculopathy, lumbar region: Secondary | ICD-10-CM

## 2013-12-12 DIAGNOSIS — G4730 Sleep apnea, unspecified: Secondary | ICD-10-CM

## 2013-12-12 DIAGNOSIS — Z0000 Encounter for general adult medical examination without abnormal findings: Secondary | ICD-10-CM

## 2013-12-12 DIAGNOSIS — M47817 Spondylosis without myelopathy or radiculopathy, lumbosacral region: Secondary | ICD-10-CM

## 2013-12-12 HISTORY — DX: Spondylosis without myelopathy or radiculopathy, lumbar region: M47.816

## 2013-12-12 LAB — BASIC METABOLIC PANEL
BUN: 14 mg/dL (ref 6–23)
CHLORIDE: 102 meq/L (ref 96–112)
CO2: 28 mEq/L (ref 19–32)
CREATININE: 1.2 mg/dL (ref 0.4–1.5)
Calcium: 9.4 mg/dL (ref 8.4–10.5)
GFR: 70.13 mL/min (ref >60.00)
Glucose, Bld: 139 mg/dL — ABNORMAL HIGH (ref 70–99)
POTASSIUM: 4.4 meq/L (ref 3.5–5.1)
Sodium: 138 mEq/L (ref 135–145)

## 2013-12-12 LAB — HEPATIC FUNCTION PANEL
ALT: 45 U/L (ref 0–53)
AST: 37 U/L (ref 0–37)
Albumin: 4.5 g/dL (ref 3.5–5.2)
Alkaline Phosphatase: 58 U/L (ref 39–117)
BILIRUBIN DIRECT: 0.1 mg/dL (ref 0.0–0.3)
BILIRUBIN TOTAL: 0.6 mg/dL (ref 0.3–1.2)
Total Protein: 7.5 g/dL (ref 6.0–8.3)

## 2013-12-12 LAB — LDL CHOLESTEROL, DIRECT: LDL DIRECT: 108.7 mg/dL

## 2013-12-12 LAB — CBC WITH DIFFERENTIAL/PLATELET
BASOS PCT: 0.4 % (ref 0.0–3.0)
Basophils Absolute: 0 10*3/uL (ref 0.0–0.1)
Eosinophils Absolute: 0.1 10*3/uL (ref 0.0–0.7)
Eosinophils Relative: 1.8 % (ref 0.0–5.0)
HCT: 47.8 % (ref 39.0–52.0)
Hemoglobin: 16.6 g/dL (ref 13.0–17.0)
Lymphocytes Relative: 30.8 % (ref 12.0–46.0)
Lymphs Abs: 2.4 10*3/uL (ref 0.7–4.0)
MCHC: 34.7 g/dL (ref 30.0–36.0)
MCV: 85.1 fl (ref 78.0–100.0)
MONO ABS: 0.5 10*3/uL (ref 0.1–1.0)
Monocytes Relative: 6.7 % (ref 3.0–12.0)
NEUTROS PCT: 60.3 % (ref 43.0–77.0)
Neutro Abs: 4.7 10*3/uL (ref 1.4–7.7)
Platelets: 271 10*3/uL (ref 150.0–400.0)
RBC: 5.61 Mil/uL (ref 4.22–5.81)
RDW: 13 % (ref 11.5–14.6)
WBC: 7.8 10*3/uL (ref 4.5–10.5)

## 2013-12-12 LAB — URINALYSIS, ROUTINE W REFLEX MICROSCOPIC
BILIRUBIN URINE: NEGATIVE
HGB URINE DIPSTICK: NEGATIVE
KETONES UR: NEGATIVE
Leukocytes, UA: NEGATIVE
Nitrite: NEGATIVE
RBC / HPF: NONE SEEN (ref 0–?)
Specific Gravity, Urine: 1.025 (ref 1.000–1.030)
Total Protein, Urine: NEGATIVE
URINE GLUCOSE: NEGATIVE
Urobilinogen, UA: 0.2 (ref 0.0–1.0)
pH: 6 (ref 5.0–8.0)

## 2013-12-12 LAB — LIPID PANEL
CHOL/HDL RATIO: 7
Cholesterol: 206 mg/dL — ABNORMAL HIGH (ref 0–200)
HDL: 31 mg/dL — ABNORMAL LOW (ref >39.00)
Triglycerides: 269 mg/dL — ABNORMAL HIGH (ref 0.0–149.0)
VLDL: 53.8 mg/dL — ABNORMAL HIGH (ref 0.0–40.0)

## 2013-12-12 LAB — TSH: TSH: 4.29 u[IU]/mL (ref 0.35–5.50)

## 2013-12-12 LAB — PSA: PSA: 0.62 ng/mL (ref 0.10–4.00)

## 2013-12-12 MED ORDER — ASPIRIN EC 81 MG PO TBEC: 81.0000 mg | DELAYED_RELEASE_TABLET | Freq: Every day | ORAL | Status: DC

## 2013-12-12 NOTE — Progress Notes (Signed)
Pre-visit discussion using our clinic review tool. No additional management support is needed unless otherwise documented below in the visit note.  

## 2013-12-12 NOTE — Progress Notes (Signed)
Subjective:    Patient ID: William [name], male    DOB: 1966/07/16, 48 y.o.   MRN: [id number]  HPI  Here for wellness and f/u;  Overall doing ok;  Pt denies CP, worsening SOB, DOE, wheezing, orthopnea, PND, worsening LE edema, palpitations, dizziness or syncope.  Pt denies neurological change such as new headache, facial or extremity weakness.  Pt denies polydipsia, polyuria, or low sugar symptoms. Pt states overall good compliance with treatment and medications, good tolerability, and has been trying to follow lower cholesterol diet.  Pt denies worsening depressive symptoms, suicidal ideation or panic. No fever, night sweats, wt loss, loss of appetite, or other constitutional symptoms.  Pt states good ability with ADL's, has low fall risk, home safety reviewed and adequate, no other significant changes in hearing or vision, and only occasionally active with exercise. Anxiety chronic no change, xanax caused sleepiness, has tried several SSRI in past with suboptimal results so not really wanting to take, but might consider it.  Needs sleep apnea f/u, needs rx update for his machine and supplies.   Wt back up now to 290, tends to stress eat, has been as low as 230 a few yrs ago.  Planning on trying to get more aerobic exercise. Pt continues to have recurring right LBP without change in severity, bowel or bladder change, fever, wt loss,  worsening LE pain/numbness/weakness, gait change or falls.  Has known lumbar DJD lower right lumbar.  Past Medical History  Diagnosis Date  . Diverticulitis    Past Surgical History  Procedure Laterality Date  . Colonoscopy      reports that he has never smoked. He has never used smokeless tobacco. He reports that he drinks alcohol. He reports that he does not use illicit drugs. family history is negative for Colon cancer. Allergies  Allergen Reactions  . Ivp Dye [Iodinated Diagnostic Agents] Hives    Per pt after CT with IV contrast aug 2014  . Niacin     Current Outpatient Prescriptions on File Prior to Visit  Medication Sig Dispense Refill  . aspirin 325 MG tablet Take 325 mg by mouth daily.      Marland Kitchen ibuprofen (ADVIL,MOTRIN) 400 MG tablet Take 400 mg by mouth every 6 (six) hours as needed for pain.      Marland Kitchen MELATONIN ER PO Take by mouth.      . ALPRAZolam (NIRAVAM) 0.5 MG dissolvable tablet Take 1 tablet (0.5 mg total) by mouth 2 (two) times daily as needed for anxiety.  60 tablet  0  . ciprofloxacin (CIPRO) 500 MG tablet Take 1 tablet (500 mg total) by mouth 2 (two) times daily.  20 tablet  0  . metroNIDAZOLE (FLAGYL) 250 MG tablet Take 1 tablet (250 mg total) by mouth 3 (three) times daily.  30 tablet  0   No current facility-administered medications on file prior to visit.   Review of Systems Constitutional: Negative for diaphoresis, activity change, appetite change or unexpected weight change.  HENT: Negative for hearing loss, ear pain, facial swelling, mouth sores and neck stiffness.   Eyes: Negative for pain, redness and visual disturbance.  Respiratory: Negative for shortness of breath and wheezing.   Cardiovascular: Negative for chest pain and palpitations.  Gastrointestinal: Negative for diarrhea, blood in stool, abdominal distention or other pain Genitourinary: Negative for hematuria, flank pain or change in urine volume.  Musculoskeletal: Negative for myalgias and joint swelling.  Skin: Negative for color change and wound.  Neurological: Negative for  syncope and numbness. other than noted Hematological: Negative for adenopathy.  Psychiatric/Behavioral: Negative for hallucinations, self-injury, decreased concentration and agitation.      Objective:   Physical Exam BP 136/74  Pulse 77  Temp(Src) 97.1 F (36.2 C) (Oral)  Resp 16  Wt 291 lb (131.997 kg)  SpO2 97% VS noted,  Constitutional: Pt is oriented to person, place, and time. Appears well-developed and well-nourished. /morbid obese Head: Normocephalic and  atraumatic.  Right Ear: External ear normal.  Left Ear: External ear normal.  Nose: Nose normal.  Mouth/Throat: Oropharynx is clear and moist.  Eyes: Conjunctivae and EOM are normal. Pupils are equal, round, and reactive to light.  Neck: Normal range of motion. Neck supple. No JVD present. No tracheal deviation present.  Cardiovascular: Normal rate, regular rhythm, normal heart sounds and intact distal pulses.   Pulmonary/Chest: Effort normal and breath sounds normal.  Abdominal: Soft. Bowel sounds are normal. There is no tenderness. No HSM  Musculoskeletal: Normal range of motion. Exhibits no edema.  Lymphadenopathy:  Has no cervical adenopathy.  Neurological: Pt is alert and oriented to person, place, and time. Pt has normal reflexes. No cranial nerve deficit.  Skin: Skin is warm and dry. No rash noted.  Psychiatric:  Has  normal mood and affect. Behavior is normal.         Assessment & Plan:

## 2013-12-12 NOTE — Patient Instructions (Addendum)
Your EKG was OK today Please change the aspirin from 325 mg per day, to the 81 mg per day (still enteric Coated though) Please continue all other medications as before, except please consider taking citalopram, or lexapro or zoloft (all are generic) Please continue your efforts at being more active, low cholesterol diet, and weight control. You are otherwise up to date with prevention measures today. You will be contacted regarding the referral for: pulmonary for the sleep apnea  Please go to the LAB in the Basement (turn left off the elevator) for the tests to be done today You will be contacted by phone if any changes need to be made immediately.  Otherwise, you will receive a letter about your results with an explanation, but please check with MyChart first.  Please remember to sign up for My Chart if you have not done so, as this will be important to you in the future with finding out test results, communicating by private email, and scheduling acute appointments online when needed.  Please return in 1 year for your yearly visit, or sooner if needed, with Lab testing done 3-5 days before

## 2013-12-12 NOTE — Assessment & Plan Note (Signed)

## 2014-01-08 ENCOUNTER — Ambulatory Visit (INDEPENDENT_AMBULATORY_CARE_PROVIDER_SITE_OTHER): Payer: Managed Care, Other (non HMO) | Admitting: Pulmonary Disease

## 2014-01-08 ENCOUNTER — Encounter: Payer: Self-pay | Admitting: Pulmonary Disease

## 2014-01-08 VITALS — BP 132/90 | HR 80 | Temp 98.0°F | Ht 74.0 in | Wt 294.0 lb

## 2014-01-08 DIAGNOSIS — G4733 Obstructive sleep apnea (adult) (pediatric): Secondary | ICD-10-CM

## 2014-01-08 NOTE — Progress Notes (Signed)
Subjective:    Patient ID: William [name], male    DOB: [birth year], 48 y.o.   MRN: [id number]  HPI The patient is a 48 year old male who I've been asked to see for management of obstructive sleep apnea. He was diagnosed with sleep apnea in 2003, and tells me that it was mild in nature. He was started on CPAP at a pressure of 8 cm of water, and has done extremely well since being on the device. His current CPAP machine is about 68-7 years old. The patient uses nasal pillows, and feels that he is able to keep his mouth closed most of the time. He does not use a heated humidifier, and has no issues with dryness. He sleeps well and night, and feels completely rested in the mornings upon arising. His wife has never commented on breakthrough snoring of significance. He feels well during the day with completely normal alertness, and has no issues with driving. His weight is up 30 pounds from his original study, and his Epworth score today is only one. He has not had his pressure optimized again since the original study, and is also interested in getting a new CPAP device.   Sleep Questionnaire What time do you typically go to bed?( Between what hours) 11p-12a 11p-12a at 1350 on 01/08/14 by Virl Cagey, CMA How long does it take you to fall asleep? 1hr 1hr at 1350 on 01/08/14 by Virl Cagey, CMA How many times during the night do you wake up? 3 3 at 1350 on 01/08/14 by Virl Cagey, CMA What time do you get out of bed to start your day? 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 (In pounds) 30 lb (13.608 kg) 30 lb (13.608 kg) at 1350 on 01/08/14 by Virl Cagey, CMA Have you ever had a sleep study before? Yes Yes at 1350 on 01/08/14 by Virl Cagey, CMA If  yes, location of study? [place] at 1350 on 01/08/14 by Virl Cagey, CMA If yes, date of study? 2003 2003 at 1350 on 01/08/14 by Virl Cagey, CMA Do you currently use CPAP? Yes Yes at 1350 on 01/08/14 by Virl Cagey, CMA If so, what pressure? ? ? at 1350 on 01/08/14 by Virl Cagey, CMA Do you wear oxygen at any time? No No at 1350 on 01/08/14 by Virl Cagey, CMA   Review of Systems  Constitutional: Negative for fever and unexpected weight change.  HENT: Negative for congestion, dental problem, ear pain, nosebleeds, postnasal drip, rhinorrhea, sinus pressure, sneezing, sore throat and trouble swallowing.   Eyes: Negative for redness and itching.  Respiratory: Negative for cough, chest tightness, shortness of breath and wheezing.   Cardiovascular: Negative for palpitations and leg swelling.  Gastrointestinal: Negative for nausea and vomiting.  Genitourinary: Negative for dysuria.  Musculoskeletal: Negative for joint swelling.  Skin: Negative for rash.  Neurological: Negative for headaches.  Hematological: Does not bruise/bleed easily.  Psychiatric/Behavioral: Negative for dysphoric mood. The patient is not nervous/anxious.        Objective:   Physical Exam Constitutional:  Overweight male, no acute distress  HENT:  Nares patent without discharge  Oropharynx without exudate, palate and uvula are  elongated  Eyes:  Perrla, eomi, no scleral icterus  Neck:  No JVD, no TMG  Cardiovascular:  Normal rate, regular rhythm, no rubs or gallops.  No murmurs        Intact distal pulses  Pulmonary :  Normal breath sounds, no stridor or respiratory distress   No rales, rhonchi, or wheezing  Abdominal:  Soft, nondistended, bowel sounds present.  No tenderness noted.   Musculoskeletal:  No lower extremity edema noted.  Lymph Nodes:  No cervical lymphadenopathy noted  Skin:  No cyanosis noted  Neurologic:  Alert, appropriate, moves all 4 extremities  without obvious deficit.         Assessment & Plan:

## 2014-01-08 NOTE — Patient Instructions (Signed)
Will optimize your pressure on an auto loaner device, and let you know the results. Do some research regarding different types of devices, and let me know what you think Work on weight loss followup with me again in one year, but call if having issues with your cpap.

## 2014-01-08 NOTE — Assessment & Plan Note (Signed)
The patient has a long history of obstructive sleep apnea, and has done very well on CPAP. He feels that he is resting well with the device, and is very satisfied with his daytime alertness. He is in need of a new device and also new supplies. He has not had his pressure optimize since his initial study in 2003, and we'll therefore use a loaner automatic device in order to do this. The patient is to decide how he wants to obtain his new CPAP device, and let me know. I've also encouraged him to work aggressively on weight loss.

## 2014-04-02 ENCOUNTER — Telehealth: Payer: Self-pay | Admitting: Pulmonary Disease

## 2014-04-02 DIAGNOSIS — G4733 Obstructive sleep apnea (adult) (pediatric): Secondary | ICD-10-CM

## 2014-04-02 NOTE — Telephone Encounter (Signed)
That is fine.  Order William [name]

## 2014-04-02 NOTE — Telephone Encounter (Signed)
Order has been placed. Will route message to [place] so that she is aware.

## 2014-04-02 NOTE — Telephone Encounter (Signed)
[place], would you like [place] to go ahead and order a sleep study on patient? Thanks.

## 2014-04-02 NOTE — Telephone Encounter (Signed)
Noted will schedule and call pt William [name]

## 2014-05-12 ENCOUNTER — Ambulatory Visit (HOSPITAL_BASED_OUTPATIENT_CLINIC_OR_DEPARTMENT_OTHER): Payer: Managed Care, Other (non HMO) | Attending: Pulmonary Disease | Admitting: Radiology

## 2014-05-12 VITALS — Ht 74.0 in | Wt 290.0 lb

## 2014-05-12 DIAGNOSIS — R0989 Other specified symptoms and signs involving the circulatory and respiratory systems: Secondary | ICD-10-CM | POA: Insufficient documentation

## 2014-05-12 DIAGNOSIS — R0609 Other forms of dyspnea: Secondary | ICD-10-CM | POA: Insufficient documentation

## 2014-05-12 DIAGNOSIS — G4733 Obstructive sleep apnea (adult) (pediatric): Secondary | ICD-10-CM

## 2014-05-21 DIAGNOSIS — G4710 Hypersomnia, unspecified: Secondary | ICD-10-CM

## 2014-05-21 DIAGNOSIS — G4730 Sleep apnea, unspecified: Secondary | ICD-10-CM

## 2014-05-21 NOTE — Sleep Study (Signed)
   NAME: William [name] DATE OF BIRTH:  04-Jul-1966 MEDICAL RECORD NUMBER [id number]  LOCATION: [facility]  PHYSICIAN: [place] OF STUDY: 05/12/2014  SLEEP STUDY TYPE: Nocturnal Polysomnogram               REFERRING PHYSICIAN: Clance, Armando Reichert, MD  INDICATION FOR STUDY: Hypersomnia with sleep apnea  EPWORTH SLEEPINESS SCORE:  2 HEIGHT: 6\' 2"  (188 cm)  WEIGHT: 290 lb (131.543 kg)    Body mass index is 37.22 kg/(m^2).  NECK SIZE: 17.5 in.  MEDICATIONS: Reviewed in the sleep record  SLEEP ARCHITECTURE: The patient had a total sleep time of 169 minutes, with no slow-wave sleep and only 17 minutes of REM. Sleep onset latency was prolonged at 136 minutes, and REM onset was normal. Sleep efficiency was poor at 47%  RESPIRATORY DATA: The patient was found to have 2 obstructive apneas and 68 obstructive hypopneas, giving him an AHI of 25 events per hour. The events occurred in all body positions, and there was moderate snoring noted throughout.  OXYGEN DATA: There was oxygen desaturation as low as 86% with the patient's obstructive events  CARDIAC DATA: No clinically significant arrhythmias were seen  MOVEMENT/PARASOMNIA: No limb movements or other abnormal behaviors were noted  IMPRESSION/ RECOMMENDATION:    1) moderate obstructive sleep apnea/hypopnea syndrome, with an AHI of 25 events per hour and oxygen desaturation as low as 86%. Treatment for this degree of sleep apnea can include a trial of weight loss alone, upper airway surgery, dental appliance, and also CPAP. Clinical correlation is suggested.     [place], American Board of Sleep Medicine  ELECTRONICALLY SIGNED ON:  05/21/2014, 5:15 PM [place] PH: [phone]   FX: [phone] [place]

## 2014-05-22 ENCOUNTER — Telehealth: Payer: Self-pay | Admitting: Pulmonary Disease

## 2014-05-22 DIAGNOSIS — G4733 Obstructive sleep apnea (adult) (pediatric): Secondary | ICD-10-CM

## 2014-05-22 NOTE — Telephone Encounter (Signed)
Called pt and left message on cell that we have re-documented his osa, and now can get new device for him.  I asked him to call me if he wishes [place] to order his new cpap device.

## 2014-05-22 NOTE — Telephone Encounter (Signed)
LMTC x 1  

## 2014-05-23 NOTE — Telephone Encounter (Signed)
Per 6.18.15 phone note created by Dr Gwenette Greet:  Kathee Delton, MD at 05/22/2014 8:46 AM     Called pt and left message on cell that we have re-documented his osa, and now can get new device for him. I asked him to call me if he wishes [place] to order his new cpap device.   LMOM TCB x1.

## 2014-05-23 NOTE — Telephone Encounter (Signed)
I called spoke with William [name]. He is wanting to order a new CPAP machine.  He reports he travels a lot and is wanting to look around for the type/size of machine he wants.  William [name] is requesting a smaller machine. He does not use a humidifier.  He wants [place] to order a machine that can fit his needs. Also can he have the mask of his choice?  Please advise [place] Thanks

## 2014-05-23 NOTE — Telephone Encounter (Signed)
Lmtcbx2. Federico Maiorino, CMA  

## 2014-05-23 NOTE — Telephone Encounter (Signed)
Called spoke with pt. He will go ahead and get the regular CPAP order. Please advice settings and if you want resmed s10? Also pt wants mask of his choosing. Thanks [facility]

## 2014-05-23 NOTE — Telephone Encounter (Signed)
Ok to order the s10 air/auto with climate control tubing and h/h, set on auto 5-20cm and leave there.  Mask of choice Enroll in airview.

## 2014-05-23 NOTE — Telephone Encounter (Signed)
They make a travel cpap machine that is very small, but would not recommend for daily use while at home.  Most people who get this have a regular machine at home, and purchase a travel machine on their own dime since insurance will not cover both.

## 2014-05-23 NOTE — Telephone Encounter (Signed)
Pt returning call.William [name] ° °

## 2014-05-23 NOTE — Telephone Encounter (Signed)
Pt returned call.  He states to please call his home # [phone].

## 2014-05-26 NOTE — Telephone Encounter (Signed)
Order has been placed. Nothing further needed. 

## 2014-06-02 ENCOUNTER — Telehealth: Payer: Self-pay | Admitting: Pulmonary Disease

## 2014-06-02 DIAGNOSIS — G4733 Obstructive sleep apnea (adult) (pediatric): Secondary | ICD-10-CM

## 2014-06-02 NOTE — Telephone Encounter (Signed)
Called and spoke with pt and he stated that the cpap setting that is on 5-20 and he feels that the setting that is on 5 is way too low and he feels like this does not give him enough volume.  He wanted to see if [facility] would change the settings to be 8-20.    Pt also stated that [facility] has a rental program that he is having to use.  [facility] told him that he could get [place] to write the rx for the cpap to state that this is a necessity for the pt so he does not have to do the rental program and he will not have to go over into the next year and he will not have to pay so much out of pocket.  [place] please advise. Thanks  Pt also stated that his new machine is great but very bulky and he stated that he does not feel that he will be able to travel with this machine.  He stated that he has his old machine and will probably take this with him on any trips but wanted to know if he should leave the setting on 8 or should he adjust this setting.    [place] please advise and pt is ok with a call back tomorrow.    Allergies  Allergen Reactions  . Ivp Dye [Iodinated Diagnostic Agents] Hives    Per pt after CT with IV contrast aug 2014  . Niacin      Current Outpatient Prescriptions on File Prior to Visit  Medication Sig Dispense Refill  . aspirin EC 81 MG tablet Take 1 tablet (81 mg total) by mouth daily.  90 tablet  11  . MELATONIN ER PO Take by mouth.       No current facility-administered medications on file prior to visit.

## 2014-06-02 NOTE — Telephone Encounter (Signed)
1) ok to set on 8-20cm  2) will need to call [facility] to verify this.  Does not sound right to me.  3) the s10 is smaller than any other older machine if you include the heated humidifier.   If he insists on using his old machine for travel, will need to get download after a few months to see his optimal pressure and set old device to that level

## 2014-06-03 NOTE — Telephone Encounter (Signed)
Called spoke w/ [place] from [place]. She is showing pt is not renting the machine but is now purchased. Lynn Ito is going to call him today to get more information. I called pt and made him aware. Will send order to [facility] for new settings. Nothing further needed

## 2014-07-31 ENCOUNTER — Encounter: Payer: Self-pay | Admitting: Gastroenterology

## 2014-12-16 ENCOUNTER — Other Ambulatory Visit (INDEPENDENT_AMBULATORY_CARE_PROVIDER_SITE_OTHER): Payer: Managed Care, Other (non HMO)

## 2014-12-16 DIAGNOSIS — Z0000 Encounter for general adult medical examination without abnormal findings: Secondary | ICD-10-CM

## 2014-12-16 LAB — URINALYSIS, ROUTINE W REFLEX MICROSCOPIC
Bilirubin Urine: NEGATIVE
HGB URINE DIPSTICK: NEGATIVE
KETONES UR: NEGATIVE
Leukocytes, UA: NEGATIVE
Nitrite: NEGATIVE
RBC / HPF: NONE SEEN (ref 0–?)
Specific Gravity, Urine: <=1.005 — AB (ref 1.000–1.030)
TOTAL PROTEIN, URINE-UPE24: NEGATIVE
UROBILINOGEN UA: 0.2 (ref 0.0–1.0)
Urine Glucose: NEGATIVE
WBC, UA: NONE SEEN (ref 0–?)
pH: 7 (ref 5.0–8.0)

## 2014-12-16 LAB — BASIC METABOLIC PANEL
BUN: 12 mg/dL (ref 6–23)
CALCIUM: 9.6 mg/dL (ref 8.4–10.5)
CO2: 26 mEq/L (ref 19–32)
CREATININE: 1.1 mg/dL (ref 0.4–1.5)
Chloride: 104 mEq/L (ref 96–112)
GFR: 75.8 mL/min (ref >60.00)
GLUCOSE: 116 mg/dL — AB (ref 70–99)
Potassium: 4.7 mEq/L (ref 3.5–5.1)
Sodium: 136 mEq/L (ref 135–145)

## 2014-12-16 LAB — HEPATIC FUNCTION PANEL
ALK PHOS: 60 U/L (ref 39–117)
ALT: 42 U/L (ref 0–53)
AST: 34 U/L (ref 0–37)
Albumin: 4.7 g/dL (ref 3.5–5.2)
BILIRUBIN DIRECT: 0.2 mg/dL (ref 0.0–0.3)
Total Bilirubin: 1 mg/dL (ref 0.2–1.2)
Total Protein: 7.6 g/dL (ref 6.0–8.3)

## 2014-12-16 LAB — CBC WITH DIFFERENTIAL/PLATELET
BASOS ABS: 0 10*3/uL (ref 0.0–0.1)
Basophils Relative: 0.5 % (ref 0.0–3.0)
Eosinophils Absolute: 0.2 10*3/uL (ref 0.0–0.7)
Eosinophils Relative: 1.9 % (ref 0.0–5.0)
HCT: 50.2 % (ref 39.0–52.0)
Hemoglobin: 17 g/dL (ref 13.0–17.0)
LYMPHS PCT: 36.1 % (ref 12.0–46.0)
Lymphs Abs: 2.8 10*3/uL (ref 0.7–4.0)
MCHC: 33.9 g/dL (ref 30.0–36.0)
MCV: 86.4 fl (ref 78.0–100.0)
MONO ABS: 0.6 10*3/uL (ref 0.1–1.0)
Monocytes Relative: 8 % (ref 3.0–12.0)
Neutro Abs: 4.2 10*3/uL (ref 1.4–7.7)
Neutrophils Relative %: 53.5 % (ref 43.0–77.0)
PLATELETS: 199 10*3/uL (ref 150.0–400.0)
RBC: 5.82 Mil/uL — ABNORMAL HIGH (ref 4.22–5.81)
RDW: 13.1 % (ref 11.5–15.5)
WBC: 7.9 10*3/uL (ref 4.0–10.5)

## 2014-12-16 LAB — LDL CHOLESTEROL, DIRECT: Direct LDL: 101.8 mg/dL

## 2014-12-16 LAB — LIPID PANEL
Cholesterol: 209 mg/dL — ABNORMAL HIGH (ref 0–200)
HDL: 28.3 mg/dL — AB (ref >39.00)
NonHDL: 180.7
TRIGLYCERIDES: 319 mg/dL — AB (ref 0.0–149.0)
Total CHOL/HDL Ratio: 7
VLDL: 63.8 mg/dL — ABNORMAL HIGH (ref 0.0–40.0)

## 2014-12-16 LAB — PSA: PSA: 0.52 ng/mL (ref 0.10–4.00)

## 2014-12-16 LAB — TSH: TSH: 3.35 u[IU]/mL (ref 0.35–4.50)

## 2014-12-18 ENCOUNTER — Encounter: Payer: Self-pay | Admitting: Internal Medicine

## 2014-12-18 ENCOUNTER — Ambulatory Visit (INDEPENDENT_AMBULATORY_CARE_PROVIDER_SITE_OTHER): Payer: Managed Care, Other (non HMO) | Admitting: Internal Medicine

## 2014-12-18 VITALS — BP 120/82 | HR 79 | Temp 98.0°F | Ht 74.0 in | Wt 300.0 lb

## 2014-12-18 DIAGNOSIS — R739 Hyperglycemia, unspecified: Secondary | ICD-10-CM

## 2014-12-18 DIAGNOSIS — Z0000 Encounter for general adult medical examination without abnormal findings: Secondary | ICD-10-CM

## 2014-12-18 DIAGNOSIS — E785 Hyperlipidemia, unspecified: Secondary | ICD-10-CM

## 2014-12-18 DIAGNOSIS — E1140 Type 2 diabetes mellitus with diabetic neuropathy, unspecified: Secondary | ICD-10-CM | POA: Insufficient documentation

## 2014-12-18 DIAGNOSIS — E119 Type 2 diabetes mellitus without complications: Secondary | ICD-10-CM | POA: Insufficient documentation

## 2014-12-18 DIAGNOSIS — I10 Essential (primary) hypertension: Secondary | ICD-10-CM

## 2014-12-18 NOTE — Assessment & Plan Note (Signed)

## 2014-12-18 NOTE — Assessment & Plan Note (Signed)
Also for a1c in 3 mo

## 2014-12-18 NOTE — Progress Notes (Signed)
Pre visit review using our clinic review tool, if applicable. No additional management support is needed unless otherwise documented below in the visit note. 

## 2014-12-18 NOTE — Progress Notes (Signed)
Subjective:    Patient ID: William [name], male    DOB: 1966-11-07, 49 y.o.   MRN: [id number]  HPI  Here for wellness and f/u;  Overall doing ok;  Pt denies CP, worsening SOB, DOE, wheezing, orthopnea, PND, worsening LE edema, palpitations, dizziness or syncope.  Pt denies neurological change such as new headache, facial or extremity weakness.  Pt denies polydipsia, polyuria, or low sugar symptoms. Pt states overall good compliance with treatment and medications, good tolerability, and has been trying to follow lower cholesterol diet.  Pt denies worsening depressive symptoms, suicidal ideation or panic. No fever, night sweats, wt loss, loss of appetite, or other constitutional symptoms.  Pt states good ability with ADL's, has low fall risk, home safety reviewed and adequate, no other significant changes in hearing or vision, and only occasionally active with exercise, with walking 1 hr per day for 4 days per wk. Has gained several lbs again, plans to try to lose to 270 soon. Pt continues to have recurring LBP without change in severity, bowel or bladder change, fever, wt loss,  worsening LE pain/numbness/weakness, gait change or falls. Past Medical History  Diagnosis Date  . Diverticulitis   . Degenerative arthritis of lumbar spine 12/12/2013  . Sleep apnea    Past Surgical History  Procedure Laterality Date  . Colonoscopy      reports that he has never smoked. He has never used smokeless tobacco. He reports that he drinks alcohol. He reports that he does not use illicit drugs. family history includes Cancer in his mother; Hyperlipidemia in his mother. There is no history of Colon cancer. Allergies  Allergen Reactions  . Ivp Dye [Iodinated Diagnostic Agents] Hives    Per pt after CT with IV contrast aug 2014  . Niacin    Current Outpatient Prescriptions on File Prior to Visit  Medication Sig Dispense Refill  . aspirin EC 81 MG tablet Take 1 tablet (81 mg total) by mouth daily. 90 tablet 11     No current facility-administered medications on file prior to visit.   Review of Systems Constitutional: Negative for increased diaphoresis, other activity, appetite or other siginficant weight change  HENT: Negative for worsening hearing loss, ear pain, facial swelling, mouth sores and neck stiffness.   Eyes: Negative for other worsening pain, redness or visual disturbance.  Respiratory: Negative for shortness of breath and wheezing.   Cardiovascular: Negative for chest pain and palpitations.  Gastrointestinal: Negative for diarrhea, blood in stool, abdominal distention or other pain Genitourinary: Negative for hematuria, flank pain or change in urine volume.  Musculoskeletal: Negative for myalgias or other joint complaints.  Skin: Negative for color change and wound.  Neurological: Negative for syncope and numbness. other than noted Hematological: Negative for adenopathy. or other swelling Psychiatric/Behavioral: Negative for hallucinations, self-injury, decreased concentration or other worsening agitation.      Objective:   Physical Exam BP 120/82 mmHg  Pulse 79  Temp(Src) 98 F (36.7 C) (Oral)  Ht 6\' 2"  (1.88 m)  Wt 300 lb (136.079 kg)  BMI 38.50 kg/m2  SpO2 95% VS noted,  Constitutional: Pt is oriented to person, place, and time. Appears well-developed and well-nourished.  Head: Normocephalic and atraumatic.  Right Ear: External ear normal.  Left Ear: External ear normal.  Nose: Nose normal.  Mouth/Throat: Oropharynx is clear and moist.  Eyes: Conjunctivae and EOM are normal. Pupils are equal, round, and reactive to light.  Neck: Normal range of motion. Neck supple. No JVD present.  No tracheal deviation present.  Cardiovascular: Normal rate, regular rhythm, normal heart sounds and intact distal pulses.   Pulmonary/Chest: Effort normal and breath sounds without rales or wheezing  Abdominal: Soft. Bowel sounds are normal. NT. No HSM  Musculoskeletal: Normal range of  motion. Exhibits no edema.  Lymphadenopathy:  Has no cervical adenopathy.  Neurological: Pt is alert and oriented to person, place, and time. Pt has normal reflexes. No cranial nerve deficit. Motor grossly intact Skin: Skin is warm and dry. No rash noted.  Psychiatric:  Has normal mood and affect. Behavior is normal.    Wt Readings from Last 3 Encounters:  12/18/14 300 lb (136.079 kg)  05/12/14 290 lb (131.543 kg)  01/08/14 294 lb (133.358 kg)      Assessment & Plan:

## 2014-12-18 NOTE — Assessment & Plan Note (Addendum)
stable overall by history and exam, recent data reviewed with pt with elev TG and low HDL, to be more active, lost wt,, and pt to continue medical treatment as before,  to f/u  Labs in 3 mo Lab Results  Component Value Date   LDLCALC 93 10/27/2010

## 2014-12-18 NOTE — Assessment & Plan Note (Signed)
stable overall by history and exam, recent data reviewed with pt, and pt to continue medical treatment as before,  to f/u any worsening symptoms or concerns Lab Results  Component Value Date   LDLCALC 93 10/27/2010

## 2014-12-18 NOTE — Patient Instructions (Signed)
Please continue all other medications as before, and refills have been done if requested.  Please have the pharmacy call with any other refills you may need.  Please continue your efforts at being more active, low cholesterol diet, and weight control.  You are otherwise up to date with prevention measures today.  Please keep your appointments with your specialists as you may have planned  Please go to the LAB in the Basement (turn left off the elevator) for the tests to be done in 3 months  Please return in 1 year for your yearly visit, or sooner if needed, with Lab testing done 3-5 days before

## 2014-12-19 ENCOUNTER — Telehealth: Payer: Self-pay | Admitting: Internal Medicine

## 2014-12-19 NOTE — Telephone Encounter (Signed)
emmi mailed  °

## 2015-01-08 ENCOUNTER — Ambulatory Visit: Payer: Self-pay | Admitting: Pulmonary Disease

## 2015-06-12 ENCOUNTER — Encounter: Payer: Self-pay | Admitting: Family

## 2015-06-12 ENCOUNTER — Ambulatory Visit (INDEPENDENT_AMBULATORY_CARE_PROVIDER_SITE_OTHER): Payer: Managed Care, Other (non HMO) | Admitting: Family

## 2015-06-12 VITALS — BP 124/90 | HR 95 | Temp 100.4°F | Resp 18 | Ht 74.0 in | Wt 297.8 lb

## 2015-06-12 DIAGNOSIS — R05 Cough: Secondary | ICD-10-CM | POA: Diagnosis not present

## 2015-06-12 DIAGNOSIS — R059 Cough, unspecified: Secondary | ICD-10-CM | POA: Insufficient documentation

## 2015-06-12 MED ORDER — CEFUROXIME AXETIL 250 MG PO TABS: 250.0000 mg | ORAL_TABLET | Freq: Two times a day (BID) | ORAL | Status: DC

## 2015-06-12 NOTE — Progress Notes (Signed)
   Subjective:    Patient ID: William [name], male    DOB: [birth year], 49 y.o.   MRN: [id number]  Chief Complaint  Patient presents with  . Cough    x3 days, productive cough, headache, fever, wheezing, and SOB    HPI:  William [name] is a 49 y.o. male with a PMH of hypertension, fatty liver disease, hyperlipidemia, depression, headaches, and hyperglycemia who presents today for an acute office visit.  This is a new problem. Associated symptoms of productive cough, headache, fever, wheezing, and shortness of breath, going on for approximately 3 days. Modifying factors include Muciniex and ibuprofen. Indicates that he has improved some compared to yesterday. The severity of the symptoms is enough to keep him up for 1 night. Denies recent antibiotic use.   Allergies  Allergen Reactions  . Ivp Dye [Iodinated Diagnostic Agents] Hives    Per pt after CT with IV contrast aug 2014  . Niacin     Current Outpatient Prescriptions on File Prior to Visit  Medication Sig Dispense Refill  . aspirin EC 81 MG tablet Take 1 tablet (81 mg total) by mouth daily. 90 tablet 11   No current facility-administered medications on file prior to visit.    Review of Systems  Constitutional: Positive for fever and chills.  HENT: Positive for congestion and sore throat. Negative for sinus pressure.   Respiratory: Positive for cough, shortness of breath and wheezing.       Objective:    BP 124/90 mmHg  Pulse 95  Temp(Src) 100.4 F (38 C) (Oral)  Resp 18  Ht 6\' 2"  (1.88 m)  Wt 297 lb 12.8 oz (135.081 kg)  BMI 38.22 kg/m2  SpO2 94% Nursing note and vital signs reviewed.  Physical Exam  Constitutional: He is oriented to person, place, and time. He appears well-developed and well-nourished. No distress.  HENT:  Right Ear: Hearing, external ear and ear canal normal.  Left Ear: Hearing, tympanic membrane, external ear and ear canal normal.  Nose: Right sinus exhibits maxillary sinus tenderness.  Right sinus exhibits no frontal sinus tenderness. Left sinus exhibits maxillary sinus tenderness. Left sinus exhibits no frontal sinus tenderness.  Mouth/Throat: Uvula is midline, oropharynx is clear and moist and mucous membranes are normal.  Impacted cerumen noted right ear.  Neck: Neck supple.  Cardiovascular: Normal rate, regular rhythm, normal heart sounds and intact distal pulses.   Pulmonary/Chest: Effort normal and breath sounds normal.  Lymphadenopathy:    He has no cervical adenopathy.  Neurological: He is alert and oriented to person, place, and time.  Skin: Skin is warm and dry.  Psychiatric: He has a normal mood and affect. His behavior is normal. Judgment and thought content normal.       Assessment & Plan:   Problem List Items Addressed This Visit      Other   Cough - Primary    Symptoms and exam consistent with a viral infection. Continue conservative treatment at this time with over-the-counter medications as needed for symptom relief and supportive care. Prescription for Ceftin provided to patient if symptoms worsen. Follow up if worsening following antibiotic usage if needed.      Relevant Medications   cefUROXime (CEFTIN) 250 MG tablet

## 2015-06-12 NOTE — Progress Notes (Signed)
Pre visit review using our clinic review tool, if applicable. No additional management support is needed unless otherwise documented below in the visit note. 

## 2015-06-12 NOTE — Assessment & Plan Note (Signed)
Symptoms and exam consistent with a viral infection. Continue conservative treatment at this time with over-the-counter medications as needed for symptom relief and supportive care. Prescription for Ceftin provided to patient if symptoms worsen. Follow up if worsening following antibiotic usage if needed.

## 2015-06-12 NOTE — Patient Instructions (Signed)
Thank you for choosing [facility].  Summary/Instructions:  Your prescription(s) have been submitted to your pharmacy or been printed and provided for you. Please take as directed and contact our office if you believe you are having problem(s) with the medication(s) or have any questions.  If your symptoms worsen or fail to improve, please contact our office for further instruction, or in case of emergency go directly to the emergency room at the closest medical facility.   Please hold off on the antibiotic unless your symptoms worsen.   General Recommendations:    Please drink plenty of fluids.  Get plenty of rest   Sleep in humidified air  Use saline nasal sprays  Netti pot   OTC Medications:  Decongestants - helps relieve congestion   Flonase (generic fluticasone) or Nasacort (generic triamcinolone) - please make sure to use the "cross-over" technique at a 45 degree angle towards the opposite eye as opposed to straight up the nasal passageway.   Sudafed (generic pseudoephedrine - Note this is the one that is available behind the pharmacy counter); Products with phenylephrine (-PE) may also be used but is often not as effective as pseudoephedrine.   If you have HIGH BLOOD PRESSURE - Coricidin HBP; AVOID any product that is -D as this contains pseudoephedrine which may increase your blood pressure.  Afrin (oxymetazoline) every 6-8 hours for up to 3 days.   Allergies - helps relieve runny nose, itchy eyes and sneezing   Claritin (generic loratidine), Allegra (fexofenidine), or Zyrtec (generic cyrterizine) for runny nose. These medications should not cause drowsiness.  Note - Benadryl (generic diphenhydramine) may be used however may cause drowsiness  Cough -   Delsym or Robitussin (generic dextromethorphan)  Expectorants - helps loosen mucus to ease removal   Mucinex (generic guaifenesin) as directed on the package.  Headaches / General Aches   Tylenol  (generic acetaminophen) - DO NOT EXCEED 3 grams (3,000 mg) in a 24 hour time period  Advil/Motrin (generic ibuprofen)   Sore Throat -   Salt water gargle   Chloraseptic (generic benzocaine) spray or lozenges / Sucrets (generic dyclonine)

## 2015-12-22 ENCOUNTER — Other Ambulatory Visit (INDEPENDENT_AMBULATORY_CARE_PROVIDER_SITE_OTHER): Payer: Managed Care, Other (non HMO)

## 2015-12-22 DIAGNOSIS — R739 Hyperglycemia, unspecified: Secondary | ICD-10-CM

## 2015-12-22 DIAGNOSIS — R7989 Other specified abnormal findings of blood chemistry: Secondary | ICD-10-CM

## 2015-12-22 DIAGNOSIS — Z0000 Encounter for general adult medical examination without abnormal findings: Secondary | ICD-10-CM

## 2015-12-22 LAB — URINALYSIS, ROUTINE W REFLEX MICROSCOPIC
Bilirubin Urine: NEGATIVE
HGB URINE DIPSTICK: NEGATIVE
Ketones, ur: NEGATIVE
LEUKOCYTES UA: NEGATIVE
NITRITE: NEGATIVE
RBC / HPF: NONE SEEN (ref 0–?)
Specific Gravity, Urine: <=1.005 — AB (ref 1.000–1.030)
Total Protein, Urine: NEGATIVE
URINE GLUCOSE: NEGATIVE
Urobilinogen, UA: 0.2 (ref 0.0–1.0)
WBC, UA: NONE SEEN (ref 0–?)
pH: 6 (ref 5.0–8.0)

## 2015-12-22 LAB — BASIC METABOLIC PANEL
BUN: 11 mg/dL (ref 6–23)
CALCIUM: 9.2 mg/dL (ref 8.4–10.5)
CO2: 27 mEq/L (ref 19–32)
Chloride: 103 mEq/L (ref 96–112)
Creatinine, Ser: 1.12 mg/dL (ref 0.40–1.50)
GFR: 73.17 mL/min (ref >60.00)
Glucose, Bld: 104 mg/dL — ABNORMAL HIGH (ref 70–99)
POTASSIUM: 4.3 meq/L (ref 3.5–5.1)
SODIUM: 139 meq/L (ref 135–145)

## 2015-12-22 LAB — CBC WITH DIFFERENTIAL/PLATELET
BASOS ABS: 0.1 10*3/uL (ref 0.0–0.1)
Basophils Relative: 1 % (ref 0.0–3.0)
EOS PCT: 2.7 % (ref 0.0–5.0)
Eosinophils Absolute: 0.2 10*3/uL (ref 0.0–0.7)
HEMATOCRIT: 48.3 % (ref 39.0–52.0)
Hemoglobin: 16.4 g/dL (ref 13.0–17.0)
Lymphocytes Relative: 37.4 % (ref 12.0–46.0)
Lymphs Abs: 2.4 10*3/uL (ref 0.7–4.0)
MCHC: 33.8 g/dL (ref 30.0–36.0)
MCV: 86.8 fl (ref 78.0–100.0)
Monocytes Absolute: 0.5 10*3/uL (ref 0.1–1.0)
Monocytes Relative: 8.1 % (ref 3.0–12.0)
NEUTROS ABS: 3.2 10*3/uL (ref 1.4–7.7)
Neutrophils Relative %: 50.8 % (ref 43.0–77.0)
Platelets: 175 10*3/uL (ref 150.0–400.0)
RBC: 5.57 Mil/uL (ref 4.22–5.81)
RDW: 13.3 % (ref 11.5–15.5)
WBC: 6.4 10*3/uL (ref 4.0–10.5)

## 2015-12-22 LAB — HEPATIC FUNCTION PANEL
ALK PHOS: 59 U/L (ref 39–117)
ALT: 50 U/L (ref 0–53)
AST: 36 U/L (ref 0–37)
Albumin: 4.6 g/dL (ref 3.5–5.2)
BILIRUBIN TOTAL: 1 mg/dL (ref 0.2–1.2)
Bilirubin, Direct: 0.2 mg/dL (ref 0.0–0.3)
Total Protein: 7.3 g/dL (ref 6.0–8.3)

## 2015-12-22 LAB — HEMOGLOBIN A1C: Hgb A1c MFr Bld: 6.1 % (ref 4.6–6.5)

## 2015-12-22 LAB — LIPID PANEL
Cholesterol: 171 mg/dL (ref 0–200)
HDL: 31 mg/dL — ABNORMAL LOW (ref >39.00)
NonHDL: 139.89
Total CHOL/HDL Ratio: 6
Triglycerides: 209 mg/dL — ABNORMAL HIGH (ref 0.0–149.0)
VLDL: 41.8 mg/dL — AB (ref 0.0–40.0)

## 2015-12-22 LAB — TSH: TSH: 4.55 u[IU]/mL — ABNORMAL HIGH (ref 0.35–4.50)

## 2015-12-22 LAB — LDL CHOLESTEROL, DIRECT: Direct LDL: 106 mg/dL

## 2015-12-22 LAB — PSA: PSA: 0.35 ng/mL (ref 0.10–4.00)

## 2015-12-24 ENCOUNTER — Encounter: Payer: Self-pay | Admitting: Internal Medicine

## 2015-12-24 ENCOUNTER — Ambulatory Visit (INDEPENDENT_AMBULATORY_CARE_PROVIDER_SITE_OTHER): Payer: Managed Care, Other (non HMO) | Admitting: Internal Medicine

## 2015-12-24 VITALS — BP 126/84 | HR 71 | Temp 98.8°F | Ht 74.0 in | Wt 296.0 lb

## 2015-12-24 DIAGNOSIS — F528 Other sexual dysfunction not due to a substance or known physiological condition: Secondary | ICD-10-CM

## 2015-12-24 DIAGNOSIS — Z0000 Encounter for general adult medical examination without abnormal findings: Secondary | ICD-10-CM

## 2015-12-24 DIAGNOSIS — F411 Generalized anxiety disorder: Secondary | ICD-10-CM

## 2015-12-24 DIAGNOSIS — R739 Hyperglycemia, unspecified: Secondary | ICD-10-CM

## 2015-12-24 DIAGNOSIS — I10 Essential (primary) hypertension: Secondary | ICD-10-CM

## 2015-12-24 MED ORDER — CLONAZEPAM 0.5 MG PO TABS: 0.5000 mg | ORAL_TABLET | Freq: Two times a day (BID) | ORAL | Status: DC | PRN

## 2015-12-24 NOTE — Assessment & Plan Note (Signed)
stable overall by history and exam, recent data reviewed with pt, and pt to continue medical treatment as before,  to f/u any worsening symptoms or concerns Lab Results  Component Value Date   HGBA1C 6.1 12/22/2015

## 2015-12-24 NOTE — Assessment & Plan Note (Signed)
Mild situational, ok for klonopin prn,  to f/u any worsening symptoms or concerns

## 2015-12-24 NOTE — Progress Notes (Signed)
Pre visit review using our clinic review tool, if applicable. No additional management support is needed unless otherwise documented below in the visit note. 

## 2015-12-24 NOTE — Assessment & Plan Note (Signed)
D/w pt, declines viagra or cialis for now due to cost, but is informed viagra is generic in dec 2017

## 2015-12-24 NOTE — Assessment & Plan Note (Signed)
stable overall by history and exam, recent data reviewed with pt, and pt to continue medical treatment as before,  to f/u any worsening symptoms or concerns BP Readings from Last 3 Encounters:  12/24/15 126/84  06/12/15 124/90  12/18/14 120/82

## 2015-12-24 NOTE — Patient Instructions (Addendum)
Your EKG was OK today, as well as the recent lab testing  Please take all new medication as prescribed - the klonopin  Please call if you would want the viagra or cialis prescription  Please continue all other medications as before, and refills have been done if requested.  Please have the pharmacy call with any other refills you may need.  Please continue your efforts at being more active, low cholesterol diet, and weight control.  You are otherwise up to date with prevention measures today.  Please keep your appointments with your specialists as you may have planned  You will be contacted regarding the referral for: colonoscopy  Please return in 1 year for your yearly visit, or sooner if needed, with Lab testing done 3-5 days before

## 2015-12-24 NOTE — Progress Notes (Signed)
Subjective:    Patient ID: William [name], male    DOB: 1966-09-19, 50 y.o.   MRN: [id number]  HPI  Here for wellness and f/u;  Overall doing ok;  Pt denies Chest pain, worsening SOB, DOE, wheezing, orthopnea, PND, worsening LE edema, palpitations, dizziness or syncope.  Pt denies neurological change such as new headache, facial or extremity weakness.  Pt denies polydipsia, polyuria, or low sugar symptoms. Pt states overall good compliance with treatment and medications, good tolerability, and has been trying to follow appropriate diet.  Pt denies worsening depressive symptoms, suicidal ideation or panic, but does have significant issue with episodes of anxiety, asks for xanax, last rx was 2014, but would take klonopin. No fever, night sweats, wt loss, loss of appetite, or other constitutional symptoms.  Pt states good ability with ADL's, has low fall risk, home safety reviewed and adequate, no other significant changes in hearing or vision, and only occasionally active with exercise. Wt Had been down to the 230's range in 2010 until he tore a tendon left leg , then gained wt.last yr again Wt Readings from Last 3 Encounters:  12/24/15 296 lb (134.265 kg)  06/12/15 297 lb 12.8 oz (135.081 kg)  12/18/14 300 lb (136.079 kg)  Having some ED issue with loss of erections over the past 6-12 mo, ? Stress related but not sure. Past Medical History  Diagnosis Date  . Diverticulitis   . Degenerative arthritis of lumbar spine 12/12/2013  . Sleep apnea    Past Surgical History  Procedure Laterality Date  . Colonoscopy      reports that he has never smoked. He has never used smokeless tobacco. He reports that he drinks alcohol. He reports that he does not use illicit drugs. family history includes Cancer in his mother; Hyperlipidemia in his mother. There is no history of Colon cancer. Allergies  Allergen Reactions  . Ivp Dye [Iodinated Diagnostic Agents] Hives    Per pt after CT with IV contrast aug 2014    . Niacin    Current Outpatient Prescriptions on File Prior to Visit  Medication Sig Dispense Refill  . aspirin EC 81 MG tablet Take 1 tablet (81 mg total) by mouth daily. 90 tablet 11   No current facility-administered medications on file prior to visit.   Review of Systems Constitutional: Negative for increased diaphoresis, other activity, appetite or siginficant weight change other than noted HENT: Negative for worsening hearing loss, ear pain, facial swelling, mouth sores and neck stiffness.   Eyes: Negative for other worsening pain, redness or visual disturbance.  Respiratory: Negative for shortness of breath and wheezing  Cardiovascular: Negative for chest pain and palpitations.  Gastrointestinal: Negative for diarrhea, blood in stool, abdominal distention or other pain Genitourinary: Negative for hematuria, flank pain or change in urine volume.  Musculoskeletal: Negative for myalgias or other joint complaints.  Skin: Negative for color change and wound or drainage.  Neurological: Negative for syncope and numbness. other than noted Hematological: Negative for adenopathy. or other swelling Psychiatric/Behavioral: Negative for hallucinations, SI, self-injury, decreased concentration or other worsening agitation.      Objective:   Physical Exam BP 126/84 mmHg  Pulse 71  Temp(Src) 98.8 F (37.1 C) (Oral)  Ht 6\' 2"  (1.88 m)  Wt 296 lb (134.265 kg)  BMI 37.99 kg/m2  SpO2 99% VS noted,  Constitutional: Pt is oriented to person, place, and time. Appears well-developed and well-nourished, in no significant distress Head: Normocephalic and atraumatic.  Right Ear:  External ear normal.  Left Ear: External ear normal.  Nose: Nose normal.  Mouth/Throat: Oropharynx is clear and moist.  Eyes: Conjunctivae and EOM are normal. Pupils are equal, round, and reactive to light.  Neck: Normal range of motion. Neck supple. No JVD present. No tracheal deviation present or significant neck LA  or mass Cardiovascular: Normal rate, regular rhythm, normal heart sounds and intact distal pulses.   Pulmonary/Chest: Effort normal and breath sounds without rales or wheezing  Abdominal: Soft. Bowel sounds are normal. NT. No HSM  Musculoskeletal: Normal range of motion. Exhibits no edema.  Lymphadenopathy:  Has no cervical adenopathy.  Neurological: Pt is alert and oriented to person, place, and time. Pt has normal reflexes. No cranial nerve deficit. Motor grossly intact Skin: Skin is warm and dry. No rash noted.  Psychiatric:  Has normal mood and affect. Behavior is normal.     Assessment & Plan:

## 2015-12-24 NOTE — Addendum Note (Signed)
Addended by: Biagio Borg on: 12/24/2015 12:58 PM   Modules accepted: Miquel Dunn

## 2015-12-24 NOTE — Assessment & Plan Note (Addendum)
Overall doing well, age appropriate education and counseling updated, referrals for preventative services and immunizations addressed, dietary and smoking counseling addressed, most recent labs reviewed.  I have personally reviewed and have noted:  1) the patient's medical and social history 2) The pt's use of alcohol, tobacco, and illicit drugs 3) The patient's current medications and supplements 4) Functional ability including ADL's, fall risk, home safety risk, hearing and visual impairment 5) Diet and physical activities 6) Evidence for depression or mood disorder 7) The patient's height, weight, and BMI have been recorded in the chart  I have made referrals, and provided counseling and education based on review of the above Due for colonosocopy, will try to arrange ECG reviewed as per emr

## 2016-04-29 ENCOUNTER — Encounter: Payer: Self-pay | Admitting: Adult Health

## 2016-04-29 ENCOUNTER — Ambulatory Visit (INDEPENDENT_AMBULATORY_CARE_PROVIDER_SITE_OTHER): Payer: Managed Care, Other (non HMO) | Admitting: Adult Health

## 2016-04-29 ENCOUNTER — Telehealth: Payer: Self-pay | Admitting: Internal Medicine

## 2016-04-29 VITALS — HR 95 | Temp 99.5°F | Ht 74.0 in | Wt 297.0 lb

## 2016-04-29 DIAGNOSIS — K5732 Diverticulitis of large intestine without perforation or abscess without bleeding: Secondary | ICD-10-CM | POA: Diagnosis not present

## 2016-04-29 MED ORDER — METRONIDAZOLE 500 MG PO TABS: 500.0000 mg | ORAL_TABLET | Freq: Three times a day (TID) | ORAL | Status: DC

## 2016-04-29 MED ORDER — CIPROFLOXACIN HCL 500 MG PO TABS: 500.0000 mg | ORAL_TABLET | Freq: Two times a day (BID) | ORAL | Status: DC

## 2016-04-29 NOTE — Progress Notes (Signed)
Pre visit review using our clinic review tool, if applicable. No additional management support is needed unless otherwise documented below in the visit note. 

## 2016-04-29 NOTE — Progress Notes (Signed)
Subjective:    Patient ID: William [name], male    DOB: 1966/02/04, 50 y.o.   MRN: [id number]  HPI  50 year old male who presents to the office today for abdominal pain that he reports started at the beginning of the week. Last night around 5 pm he had a severe abdominal pain, described as " sharp" located in the suprapubic area and he felt like he has to use the restroom. He also reports low grade fever with diaphoresis yesterday evening.   He has not had any diarrhea and his last bowel movement was yesterday  When he woke up this morning and continued to have abdominal pain. Currently he does not have abdominal pain, but has a low grade fever and feels like he has to have a bowel movement.   He has a history of diverticulitis and states " every time I have had a flare, this is what it feels like."    Review of Systems  Constitutional: Positive for fever and diaphoresis. Negative for activity change and appetite change.  HENT: Negative.   Respiratory: Negative.   Cardiovascular: Negative.   Gastrointestinal: Positive for abdominal pain. Negative for nausea, vomiting, diarrhea, constipation, blood in stool and abdominal distention.  Genitourinary: Negative.   Neurological: Negative.   All other systems reviewed and are negative.  Past Medical History  Diagnosis Date  . Diverticulitis   . Degenerative arthritis of lumbar spine 12/12/2013  . Sleep apnea     Social History   Social History  . Marital Status: Married    Spouse Name: N/A  . Number of Children: N/A  . Years of Education: N/A   Occupational History  . small business owner    Social History Main Topics  . Smoking status: Never Smoker   . Smokeless tobacco: Never Used  . Alcohol Use: 0.0 oz/week    0 Standard drinks or equivalent per week     Comment: one drink per week, occ  . Drug Use: No  . Sexual Activity: Not on file   Other Topics Concern  . Not on file   Social History Narrative    Past  Surgical History  Procedure Laterality Date  . Colonoscopy      Family History  Problem Relation Age of Onset  . Colon cancer Neg Hx   . Hyperlipidemia Mother   . Cancer Mother     Allergies  Allergen Reactions  . Ivp Dye [Iodinated Diagnostic Agents] Hives    Per pt after CT with IV contrast aug 2014  . Niacin     Current Outpatient Prescriptions on File Prior to Visit  Medication Sig Dispense Refill  . aspirin EC 81 MG tablet Take 1 tablet (81 mg total) by mouth daily. (Patient not taking: Reported on 04/29/2016) 90 tablet 11  . clonazePAM (KLONOPIN) 0.5 MG tablet Take 1 tablet (0.5 mg total) by mouth 2 (two) times daily as needed for anxiety. (Patient not taking: Reported on 04/29/2016) 60 tablet 1   No current facility-administered medications on file prior to visit.    Pulse 95  Temp(Src) 99.5 F (37.5 C)  Ht 6\' 2"  (1.88 m)  Wt 297 lb (134.718 kg)  BMI 38.12 kg/m2  SpO2 94%       Objective:   Physical Exam  Constitutional: He is oriented to person, place, and time. He appears well-developed and well-nourished. No distress.  Cardiovascular: Normal rate, regular rhythm, normal heart sounds and intact distal pulses.  Exam reveals  no gallop and no friction rub.   No murmur heard. Pulmonary/Chest: Effort normal and breath sounds normal. No respiratory distress. He has no wheezes. He has no rales. He exhibits no tenderness.  Abdominal: Soft. Normal appearance. He exhibits no distension and no mass. Bowel sounds are increased. There is tenderness in the periumbilical area, suprapubic area and left lower quadrant. There is no rebound and no guarding.  Neurological: He is alert and oriented to person, place, and time.  Skin: Skin is warm and dry. No rash noted. He is not diaphoretic. No erythema. No pallor.  Psychiatric: He has a normal mood and affect. His behavior is normal. Judgment and thought content normal.  Nursing note and vitals reviewed.     Assessment & Plan:    1. Diverticulitis of colon - Will treat as diverticulitis. No acute concern for appendicitis at this time.  - ciprofloxacin (CIPRO) 500 MG tablet; Take 1 tablet (500 mg total) by mouth 2 (two) times daily.  Dispense: 14 tablet; Refill: 0 - metroNIDAZOLE (FLAGYL) 500 MG tablet; Take 1 tablet (500 mg total) by mouth 3 (three) times daily.  Dispense: 21 tablet; Refill: 0 - Follow up with PCP if no improvement  - If symptoms become worse then please go to the ER  Dorothyann Peng, NP

## 2016-04-29 NOTE — Patient Instructions (Addendum)
It was great meeting you today!  Your exam is consistent with Diverticulitis   I have sent in two antibiotics, Cipro and Flagyl. Take as directed.   If you do not feel any better over the weekend, please go to the ER  Follow up with Dr. Jenny Reichmann as needed  Diverticulitis Diverticulitis is inflammation or infection of small pouches in your colon that form when you have a condition called diverticulosis. The pouches in your colon are called diverticula. Your colon, or large intestine, is where water is absorbed and stool is formed. Complications of diverticulitis can include:  Bleeding.  Severe infection.  Severe pain.  Perforation of your colon.  Obstruction of your colon. CAUSES  Diverticulitis is caused by bacteria. Diverticulitis happens when stool becomes trapped in diverticula. This allows bacteria to grow in the diverticula, which can lead to inflammation and infection. RISK FACTORS People with diverticulosis are at risk for diverticulitis. Eating a diet that does not include enough fiber from fruits and vegetables may make diverticulitis more likely to develop. SYMPTOMS  Symptoms of diverticulitis may include:  Abdominal pain and tenderness. The pain is normally located on the left side of the abdomen, but may occur in other areas.  Fever and chills.  Bloating.  Cramping.  Nausea.  Vomiting.  Constipation.  Diarrhea.  Blood in your stool. DIAGNOSIS  Your health care provider will ask you about your medical history and do a physical exam. You may need to have tests done because many medical conditions can cause the same symptoms as diverticulitis. Tests may include:  Blood tests.  Urine tests.  Imaging tests of the abdomen, including X-rays and CT scans. When your condition is under control, your health care provider may recommend that you have a colonoscopy. A colonoscopy can show how severe your diverticula are and whether something else is causing your  symptoms. TREATMENT  Most cases of diverticulitis are mild and can be treated at home. Treatment may include:  Taking over-the-counter pain medicines.  Following a clear liquid diet.  Taking antibiotic medicines by mouth for 7-10 days. More severe cases may be treated at a hospital. Treatment may include:  Not eating or drinking.  Taking prescription pain medicine.  Receiving antibiotic medicines through an IV tube.  Receiving fluids and nutrition through an IV tube.  Surgery. HOME CARE INSTRUCTIONS   Follow your health care provider's instructions carefully.  Follow a full liquid diet or other diet as directed by your health care provider. After your symptoms improve, your health care provider may tell you to change your diet. He or she may recommend you eat a high-fiber diet. Fruits and vegetables are good sources of fiber. Fiber makes it easier to pass stool.  Take fiber supplements or probiotics as directed by your health care provider.  Only take medicines as directed by your health care provider.  Keep all your follow-up appointments. SEEK MEDICAL CARE IF:   Your pain does not improve.  You have a hard time eating food.  Your bowel movements do not return to normal. SEEK IMMEDIATE MEDICAL CARE IF:   Your pain becomes worse.  Your symptoms do not get better.  Your symptoms suddenly get worse.  You have a fever.  You have repeated vomiting.  You have bloody or black, tarry stools. MAKE SURE YOU:   Understand these instructions.  Will watch your condition.  Will get help right away if you are not doing well or get worse.   This information is  not intended to replace advice given to you by your health care provider. Make sure you discuss any questions you have with your health care provider.   Document Released: 08/31/2005 Document Revised: 11/26/2013 Document Reviewed: 10/16/2013 Elsevier Interactive Patient Education [employer].

## 2016-04-29 NOTE — Telephone Encounter (Signed)
Patient Name: William [name]  DOB: 10-03-1966    Initial Comment caller states he has abdominal pain   Nurse Assessment  Nurse: Mallie Mussel, RN, Alveta Heimlich Date/Time Eilene Ghazi Time): 04/29/2016 8:58:06 AM  Confirm and document reason for call. If symptomatic, describe symptoms. You must click the next button to save text entered. ---Caller states he has non constant lower abdominal pain which began yesterday. He rates the pain when he has it as 7 on 0-10 scale. The pain usually lasts about an hour or so. His pain has been constant for more than 2 hours this morning, but varying in its intensity. He denies vomiting and diarrhea. He has fever which began yesterday. He has not taken his temp. He has a history of diverticulitis but its been a while. He denies blood in his stools. He denies black stools. He just wants an appointment to be seen.  Has the patient traveled out of the country within the last 30 days? ---No  Does the patient have any new or worsening symptoms? ---Yes  Will a triage be completed? ---No  Select reason for no triage. ---Other     Guidelines    Guideline Title Affirmed Question Affirmed Notes       Final Disposition User        Comments  Dr. Jenny Reichmann does not have any appointments available today. There are no available appointments at the office today. I was able to schedule him to see Sallee Provencal FNP at [facility] today at 11:00am.

## 2016-06-22 ENCOUNTER — Encounter: Payer: Self-pay | Admitting: Internal Medicine

## 2016-06-27 ENCOUNTER — Encounter: Payer: Self-pay | Admitting: Gastroenterology

## 2016-12-05 HISTORY — PX: COLONOSCOPY: SHX174

## 2016-12-20 ENCOUNTER — Other Ambulatory Visit (INDEPENDENT_AMBULATORY_CARE_PROVIDER_SITE_OTHER): Payer: Managed Care, Other (non HMO)

## 2016-12-20 DIAGNOSIS — Z0000 Encounter for general adult medical examination without abnormal findings: Secondary | ICD-10-CM | POA: Diagnosis not present

## 2016-12-20 DIAGNOSIS — R739 Hyperglycemia, unspecified: Secondary | ICD-10-CM | POA: Diagnosis not present

## 2016-12-20 LAB — BASIC METABOLIC PANEL
BUN: 12 mg/dL (ref 6–23)
CALCIUM: 9.1 mg/dL (ref 8.4–10.5)
CO2: 27 meq/L (ref 19–32)
Chloride: 102 mEq/L (ref 96–112)
Creatinine, Ser: 1.12 mg/dL (ref 0.40–1.50)
GFR: 72.88 mL/min (ref >60.00)
Glucose, Bld: 125 mg/dL — ABNORMAL HIGH (ref 70–99)
POTASSIUM: 3.9 meq/L (ref 3.5–5.1)
SODIUM: 138 meq/L (ref 135–145)

## 2016-12-20 LAB — CBC WITH DIFFERENTIAL/PLATELET
Basophils Absolute: 0 10*3/uL (ref 0.0–0.1)
Basophils Relative: 0.7 % (ref 0.0–3.0)
Eosinophils Absolute: 0.2 10*3/uL (ref 0.0–0.7)
Eosinophils Relative: 2.9 % (ref 0.0–5.0)
HEMATOCRIT: 45.8 % (ref 39.0–52.0)
Hemoglobin: 16 g/dL (ref 13.0–17.0)
LYMPHS PCT: 40 % (ref 12.0–46.0)
Lymphs Abs: 2.3 10*3/uL (ref 0.7–4.0)
MCHC: 35 g/dL (ref 30.0–36.0)
MCV: 84.7 fl (ref 78.0–100.0)
MONOS PCT: 10.2 % (ref 3.0–12.0)
Monocytes Absolute: 0.6 10*3/uL (ref 0.1–1.0)
NEUTROS ABS: 2.6 10*3/uL (ref 1.4–7.7)
Neutrophils Relative %: 46.2 % (ref 43.0–77.0)
PLATELETS: 192 10*3/uL (ref 150.0–400.0)
RBC: 5.41 Mil/uL (ref 4.22–5.81)
RDW: 13 % (ref 11.5–15.5)
WBC: 5.7 10*3/uL (ref 4.0–10.5)

## 2016-12-20 LAB — URINALYSIS, ROUTINE W REFLEX MICROSCOPIC
Hgb urine dipstick: NEGATIVE
KETONES UR: NEGATIVE
Leukocytes, UA: NEGATIVE
Nitrite: NEGATIVE
PH: 6 (ref 5.0–8.0)
SPECIFIC GRAVITY, URINE: 1.02 (ref 1.000–1.030)
TOTAL PROTEIN, URINE-UPE24: NEGATIVE
URINE GLUCOSE: NEGATIVE
UROBILINOGEN UA: 0.2 (ref 0.0–1.0)

## 2016-12-20 LAB — LIPID PANEL
Cholesterol: 142 mg/dL (ref 0–200)
HDL: 24.2 mg/dL — ABNORMAL LOW (ref >39.00)
LDL CALC: 84 mg/dL (ref 0–99)
NONHDL: 117.75
Total CHOL/HDL Ratio: 6
Triglycerides: 168 mg/dL — ABNORMAL HIGH (ref 0.0–149.0)
VLDL: 33.6 mg/dL (ref 0.0–40.0)

## 2016-12-20 LAB — HEPATIC FUNCTION PANEL
ALK PHOS: 46 U/L (ref 39–117)
ALT: 52 U/L (ref 0–53)
AST: 44 U/L — ABNORMAL HIGH (ref 0–37)
Albumin: 4.4 g/dL (ref 3.5–5.2)
BILIRUBIN DIRECT: 0.2 mg/dL (ref 0.0–0.3)
Total Bilirubin: 0.8 mg/dL (ref 0.2–1.2)
Total Protein: 6.9 g/dL (ref 6.0–8.3)

## 2016-12-20 LAB — HEMOGLOBIN A1C: Hgb A1c MFr Bld: 6.7 % — ABNORMAL HIGH (ref 4.6–6.5)

## 2016-12-20 LAB — TSH: TSH: 4.28 u[IU]/mL (ref 0.35–4.50)

## 2016-12-20 LAB — PSA: PSA: 0.63 ng/mL (ref 0.10–4.00)

## 2016-12-22 ENCOUNTER — Encounter: Payer: Managed Care, Other (non HMO) | Admitting: Internal Medicine

## 2017-01-26 ENCOUNTER — Ambulatory Visit (INDEPENDENT_AMBULATORY_CARE_PROVIDER_SITE_OTHER): Payer: Managed Care, Other (non HMO) | Admitting: Internal Medicine

## 2017-01-26 ENCOUNTER — Encounter: Payer: Self-pay | Admitting: Internal Medicine

## 2017-01-26 VITALS — BP 170/110 | HR 96 | Temp 98.3°F | Ht 74.0 in | Wt 285.0 lb

## 2017-01-26 DIAGNOSIS — Z86010 Personal history of colonic polyps: Secondary | ICD-10-CM

## 2017-01-26 DIAGNOSIS — Z0000 Encounter for general adult medical examination without abnormal findings: Secondary | ICD-10-CM | POA: Diagnosis not present

## 2017-01-26 DIAGNOSIS — E119 Type 2 diabetes mellitus without complications: Secondary | ICD-10-CM

## 2017-01-26 DIAGNOSIS — Z1159 Encounter for screening for other viral diseases: Secondary | ICD-10-CM | POA: Diagnosis not present

## 2017-01-26 DIAGNOSIS — Z0001 Encounter for general adult medical examination with abnormal findings: Secondary | ICD-10-CM

## 2017-01-26 DIAGNOSIS — I10 Essential (primary) hypertension: Secondary | ICD-10-CM

## 2017-01-26 DIAGNOSIS — F339 Major depressive disorder, recurrent, unspecified: Secondary | ICD-10-CM | POA: Diagnosis not present

## 2017-01-26 DIAGNOSIS — F411 Generalized anxiety disorder: Secondary | ICD-10-CM

## 2017-01-26 NOTE — Patient Instructions (Addendum)
Your EKG was OK today  Please continue all other medications as before, and refills have been done if requested.  Please have the pharmacy call with any other refills you may need.  Please continue your efforts at being more active, low cholesterol diet, and weight control.  You are otherwise up to date with prevention measures today.  Please keep your appointments with your specialists as you may have planned  You will be contacted regarding the referral for: Dr Henrene Pastor for colonoscopy  You will be contacted regarding the referral for: counseling  Please return in 6 months, or sooner if needed, with Lab testing done 3-5 days before (to recheck the new Diabetes)

## 2017-01-26 NOTE — Progress Notes (Signed)
Pre visit review using our clinic review tool, if applicable. No additional management support is needed unless otherwise documented below in the visit note. 

## 2017-01-26 NOTE — Assessment & Plan Note (Signed)
Mild, verified no SI or HI, declines further tx

## 2017-01-26 NOTE — Progress Notes (Signed)
Subjective:    Patient ID: William [name], male    DOB: 02-Jan-1966, 51 y.o.   MRN: [id number]  HPI  Here for wellness and f/u;  Overall doing ok;  Pt denies Chest pain, worsening SOB, DOE, wheezing, orthopnea, PND, worsening LE edema, palpitations, dizziness or syncope.  Pt denies neurological change such as new headache, facial or extremity weakness.. Pt states overall good compliance with treatment and medications, good tolerability, and has been trying to follow appropriate diet.  Pt denies worsening depressive symptoms, suicidal ideation or panic. No fever, night sweats, wt loss, loss of appetite, or other constitutional symptoms.  Pt states good ability with ADL's, has low fall risk, home safety reviewed and adequate, no other significant changes in hearing or vision, and only occasionally active with exercise. Home BP at home usually < 140/90 BP Readings from Last 3 Encounters:  01/26/17 (!) 170/110  12/24/15 126/84  06/12/15 124/90  Hsa been struggling but successful at some wt loss.  Wt Readings from Last 3 Encounters:  01/26/17 285 lb (129.3 kg)  04/29/16 297 lb (134.7 kg)  12/24/15 296 lb (134.3 kg)  c/o persistent depression for over 15 yrs, with anxiety recently due toi work and family stressors,has tried mult meds per psychiatry he could not tolerate, does not favor medication,  Will accept counsleing.  Klonopin only make him sleepy. Only drinks ETOH very occasionally.  Due for colonoscopy with hx of polyps.  Pt denies polydipsia, polyuria, or low sugar symptoms such as weakness or confusion improved with po intake.  Pt states overall good compliance with meds, wt overall down several lbs with some increased exercise however.    Past Medical History:  Diagnosis Date  . Degenerative arthritis of lumbar spine 12/12/2013  . Diverticulitis   . Sleep apnea    Past Surgical History:  Procedure Laterality Date  . COLONOSCOPY      reports that he has never smoked. He has never used  smokeless tobacco. He reports that he drinks alcohol. He reports that he does not use drugs. family history includes Cancer in his mother; Hyperlipidemia in his mother. Allergies  Allergen Reactions  . Ivp Dye [Iodinated Diagnostic Agents] Hives    Per pt after CT with IV contrast aug 2014  . Niacin    Current Outpatient Prescriptions on File Prior to Visit  Medication Sig Dispense Refill  . aspirin EC 81 MG tablet Take 1 tablet (81 mg total) by mouth daily. (Patient not taking: Reported on 04/29/2016) 90 tablet 11  . ciprofloxacin (CIPRO) 500 MG tablet Take 1 tablet (500 mg total) by mouth 2 (two) times daily. 14 tablet 0  . clonazePAM (KLONOPIN) 0.5 MG tablet Take 1 tablet (0.5 mg total) by mouth 2 (two) times daily as needed for anxiety. (Patient not taking: Reported on 04/29/2016) 60 tablet 1  . metroNIDAZOLE (FLAGYL) 500 MG tablet Take 1 tablet (500 mg total) by mouth 3 (three) times daily. 21 tablet 0   No current facility-administered medications on file prior to visit.    Review of Systems Constitutional: Negative for increased diaphoresis, or other activity, appetite or siginficant weight change other than noted HENT: Negative for worsening hearing loss, ear pain, facial swelling, mouth sores and neck stiffness.   Eyes: Negative for other worsening pain, redness or visual disturbance.  Respiratory: Negative for choking or stridor Cardiovascular: Negative for other chest pain and palpitations.  Gastrointestinal: Negative for worsening diarrhea, blood in stool, or abdominal distention Genitourinary: Negative for hematuria,  flank pain or change in urine volume.  Musculoskeletal: Negative for myalgias or other joint complaints.  Skin: Negative for other color change and wound or drainage.  Neurological: Negative for syncope and numbness. other than noted Hematological: Negative for adenopathy. or other swelling Psychiatric/Behavioral: Negative for hallucinations, SI, self-injury,  decreased concentration or other worsening agitation.  All other system neg per pt    Objective:   Physical Exam BP (!) 170/110 (BP Location: Left Arm, Patient Position: Sitting, Cuff Size: Normal)   Pulse 96   Temp 98.3 F (36.8 C) (Oral)   Ht 6\' 2"  (1.88 m)   Wt 285 lb (129.3 kg)   SpO2 97%   BMI 36.59 kg/m  VS noted,  Constitutional: Pt is oriented to person, place, and time. Appears well-developed and well-nourished, in no significant distress Head: Normocephalic and atraumatic  Eyes: Conjunctivae and EOM are normal. Pupils are equal, round, and reactive to light Right Ear: External ear normal.  Left Ear: External ear normal Nose: Nose normal.  Mouth/Throat: Oropharynx is clear and moist  Neck: Normal range of motion. Neck supple. No JVD present. No tracheal deviation present or significant neck LA or mass Cardiovascular: Normal rate, regular rhythm, normal heart sounds and intact distal pulses.   Pulmonary/Chest: Effort normal and breath sounds without rales or wheezing  Abdominal: Soft. Bowel sounds are normal. NT. No HSM  Musculoskeletal: Normal range of motion. Exhibits no edema Lymphadenopathy: Has no cervical adenopathy.  Neurological: Pt is alert and oriented to person, place, and time. Pt has normal reflexes. No cranial nerve deficit. Motor grossly intact Skin: Skin is warm and dry. No rash noted or new ulcers Psychiatric:  Has 1-2+ irritable nervous mood and affect. Behavior is normal.   ECG today I have personally interpreted Sinus  Rhythm  WITHIN NORMAL LIMITS    Assessment & Plan:

## 2017-01-28 NOTE — Assessment & Plan Note (Signed)
stable overall by history and exam, recent data reviewed with pt, and pt to continue medical treatment as before,  to f/u any worsening symptoms or concerns Lab Results  Component Value Date   HGBA1C 6.7 (H) 12/20/2016

## 2017-01-28 NOTE — Assessment & Plan Note (Signed)
For referral Dr Henrene Pastor,  to f/u any worsening symptoms or concerns

## 2017-01-28 NOTE — Assessment & Plan Note (Addendum)
Worsening recent, declines med change, for refer counseling,  to f/u any worsening symptoms or concerns  In addition to the time spent performing CPE, I spent an additional 15 minutes face to face,in which greater than 50% of this time was spent in counseling and coordination of care for patient's illness as documented.

## 2017-01-28 NOTE — Assessment & Plan Note (Signed)

## 2017-01-28 NOTE — Assessment & Plan Note (Signed)
Likely reactive today due to emotionality, stable overall by history and exam, recent data reviewed with pt, and pt to continue medical treatment as before,  to f/u any worsening symptoms or concerns BP Readings from Last 3 Encounters:  01/26/17 (!) 170/110  12/24/15 126/84  06/12/15 124/90

## 2017-01-30 ENCOUNTER — Telehealth: Payer: Self-pay | Admitting: Internal Medicine

## 2017-01-31 NOTE — Telephone Encounter (Signed)
Placed on Dr. Blanch Media desk for review.

## 2017-02-06 NOTE — Telephone Encounter (Signed)
Dr.Perry accepted patient and approved to schedule colon. Spoke with patient who was currently on vacation. Patient states he will call back to May appt

## 2017-05-03 ENCOUNTER — Telehealth: Payer: Self-pay | Admitting: Internal Medicine

## 2017-05-03 NOTE — Telephone Encounter (Signed)
Patient is having a diverticulitis flare up. Dr. Jenny Reichmann does not have an appointment until tomorrow. Patient states he can not wait that long. He also does not want to go to another office unless he has too. He would like to know if Dr. Jenny Reichmann could just send in the abx for this. Please advise. Thank you.

## 2017-05-03 NOTE — Telephone Encounter (Signed)
Pt has been informed and expressed understanding.  

## 2017-05-03 NOTE — Telephone Encounter (Signed)
Very sorry, this is too serious to manage by phone request,  I would have to say he really needs to be seen, but I am not able today.  He will need a different provider or UC or ED asap. thanks

## 2017-05-15 ENCOUNTER — Encounter: Payer: Self-pay | Admitting: Internal Medicine

## 2017-07-19 ENCOUNTER — Encounter: Payer: Self-pay | Admitting: Internal Medicine

## 2017-07-19 ENCOUNTER — Ambulatory Visit (AMBULATORY_SURGERY_CENTER): Payer: Self-pay

## 2017-07-19 VITALS — Ht 74.0 in | Wt 280.0 lb

## 2017-07-19 DIAGNOSIS — Z860100 Personal history of colon polyps, unspecified: Secondary | ICD-10-CM

## 2017-07-19 MED ORDER — SUPREP BOWEL PREP KIT 17.5-3.13-1.6 GM/177ML PO SOLN: 1.0000 | Freq: Once | ORAL | 0 refills | Status: AC

## 2017-07-19 NOTE — Progress Notes (Signed)
No allergies to eggs or soy No home oxygen No diet meds No past problems with anesthesia  Feels he is a very sensitive pt to meds  Registered emmi

## 2017-08-03 ENCOUNTER — Encounter: Payer: Self-pay | Admitting: Internal Medicine

## 2017-08-03 ENCOUNTER — Ambulatory Visit (AMBULATORY_SURGERY_CENTER): Payer: Managed Care, Other (non HMO) | Admitting: Internal Medicine

## 2017-08-03 VITALS — BP 109/70 | HR 60 | Temp 98.6°F | Resp 9 | Ht 74.0 in | Wt 280.0 lb

## 2017-08-03 DIAGNOSIS — K635 Polyp of colon: Secondary | ICD-10-CM | POA: Diagnosis not present

## 2017-08-03 DIAGNOSIS — D125 Benign neoplasm of sigmoid colon: Secondary | ICD-10-CM

## 2017-08-03 DIAGNOSIS — K621 Rectal polyp: Secondary | ICD-10-CM | POA: Diagnosis not present

## 2017-08-03 DIAGNOSIS — Z860100 Personal history of colon polyps, unspecified: Secondary | ICD-10-CM

## 2017-08-03 DIAGNOSIS — D122 Benign neoplasm of ascending colon: Secondary | ICD-10-CM

## 2017-08-03 DIAGNOSIS — D129 Benign neoplasm of anus and anal canal: Secondary | ICD-10-CM

## 2017-08-03 DIAGNOSIS — D128 Benign neoplasm of rectum: Secondary | ICD-10-CM

## 2017-08-03 MED ORDER — SODIUM CHLORIDE 0.9 % IV SOLN: 500.0000 mL | INTRAVENOUS | Status: DC

## 2017-08-03 NOTE — Patient Instructions (Signed)
   INFORMATION ON POLYPS,DIVERTICULOSIS,& HEMORRHOIDS GIVEN TO YOU TODAY  AWAIT PATHOLOGY RESULTS IN A LETTER FROM DR PERRY   YOU HAD AN ENDOSCOPIC PROCEDURE TODAY AT [facility]:   Refer to the procedure report that was given to you for any specific questions about what was found during the examination.  If the procedure report does not answer your questions, please call your gastroenterologist to clarify.  If you requested that your care partner not be given the details of your procedure findings, then the procedure report has been included in a sealed envelope for you to review at your convenience later.  YOU SHOULD EXPECT: Some feelings of bloating in the abdomen. Passage of more gas than usual.  Walking can help get rid of the air that was put into your GI tract during the procedure and reduce the bloating. If you had a lower endoscopy (such as a colonoscopy or flexible sigmoidoscopy) you may notice spotting of blood in your stool or on the toilet paper. If you underwent a bowel prep for your procedure, you may not have a normal bowel movement for a few days.  Please Note:  You might notice some irritation and congestion in your nose or some drainage.  This is from the oxygen used during your procedure.  There is no need for concern and it should clear up in a day or so.  SYMPTOMS TO REPORT IMMEDIATELY:   Following lower endoscopy (colonoscopy or flexible sigmoidoscopy):  Excessive amounts of blood in the stool  Significant tenderness or worsening of abdominal pains  Swelling of the abdomen that is new, acute  Fever of 100F or higher    For urgent or emergent issues, a gastroenterologist can be reached at any hour by calling [phone].   DIET:  We do recommend a small meal at first, but then you may proceed to your regular diet.  Drink plenty of fluids but you should avoid alcoholic beverages for 24 hours.  ACTIVITY:  You should plan to take it easy for the  rest of today and you should NOT DRIVE or use heavy machinery until tomorrow (because of the sedation medicines used during the test).    FOLLOW UP: Our staff will call the number listed on your records the next business day following your procedure to check on you and address any questions or concerns that you may have regarding the information given to you following your procedure. If we do not reach you, we will leave a message.  However, if you are feeling well and you are not experiencing any problems, there is no need to return our call.  We will assume that you have returned to your regular daily activities without incident.  If any biopsies were taken you will be contacted by phone or by letter within the next 1-3 weeks.  Please call us at [phone] if you have not heard about the biopsies in 3 weeks.    SIGNATURES/CONFIDENTIALITY: You and/or your care partner have signed paperwork which will be entered into your electronic medical record.  These signatures attest to the fact that that the information above on your After Visit Summary has been reviewed and is understood.  Full responsibility of the confidentiality of this discharge information lies with you and/or your care-partner.

## 2017-08-03 NOTE — Op Note (Signed)
[facility] Patient Name: William [name] Procedure Date: 08/03/2017 11:33 AM MRN: [id number] Endoscopist: Docia Chuck. Henrene Pastor , MD Age: 51 Referring MD:  Date of Birth: 1966/03/01 Gender: Male Account #: [id number] Procedure:                Colonoscopy, with cold snare polypectomy x 4 Indications:              High risk colon cancer surveillance: Personal                            history of multiple (3 or more) adenomas. Previous                            examinations with Dr. Deatra Ina 2005, 2009, 2014. Medicines:                Monitored Anesthesia Care Procedure:                Pre-Anesthesia Assessment:                           - Prior to the procedure, a History and Physical                            was performed, and patient medications and                            allergies were reviewed. The patient's tolerance of                            previous anesthesia was also reviewed. The risks                            and benefits of the procedure and the sedation                            options and risks were discussed with the patient.                            All questions were answered, and informed consent                            was obtained. Prior Anticoagulants: The patient has                            taken no previous anticoagulant or antiplatelet                            agents. ASA Grade Assessment: II - A patient with                            mild systemic disease. After reviewing the risks                            and benefits, the patient was deemed in  satisfactory condition to undergo the procedure.                           After obtaining informed consent, the colonoscope                            was passed under direct vision. Throughout the                            procedure, the patient's blood pressure, pulse, and                            oxygen saturations were monitored continuously. The                Colonoscope was introduced through the anus and                            advanced to the the cecum, identified by                            appendiceal orifice and ileocecal valve. The                            ileocecal valve, appendiceal orifice, and rectum                            were photographed. The quality of the bowel                            preparation was excellent. The colonoscopy was                            performed with moderate difficulty due to the                            presence of a elongated and redundant colon. The                            patient tolerated the procedure well. The bowel                            preparation used was SUPREP. Scope In: 11:44:33 AM Scope Out: 12:13:36 PM Scope Withdrawal Time: 0 hours 15 minutes 30 seconds  Total Procedure Duration: 0 hours 29 minutes 3 seconds  Findings:                 Four polyps were found in the rectum, sigmoid colon                            and ascending colon. The polyps were 1 to 3 mm in                            size. These polyps were removed with a cold snare.  Resection and retrieval were complete.                           Many small and large-mouthed diverticula were found                            in the entire colon.                           Internal hemorrhoids were found during retroflexion.                           Redundant colon.The exam was otherwise without                            abnormality on direct and retroflexion views. Complications:            No immediate complications. Estimated blood loss:                            None. Estimated Blood Loss:     Estimated blood loss: none. Impression:               - Four 1 to 3 mm polyps in the rectum, in the                            sigmoid colon and in the ascending colon, removed                            with a cold snare. Resected and retrieved.                           -  Diverticulosis in the entire examined colon.                           - Internal hemorrhoids.                           - Redundant colon.The examination was otherwise                            normal on direct and retroflexion views. Recommendation:           - Repeat colonoscopy in 5 years for surveillance.                           - Patient has a contact number available for                            emergencies. The signs and symptoms of potential                            delayed complications were discussed with the                            patient. Return to normal activities tomorrow.  Written discharge instructions were provided to the                            patient.                           - Resume previous diet.                           - Continue present medications.                           - Await pathology results. Docia Chuck. Henrene Pastor, MD 08/03/2017 12:21:16 PM This report has been signed electronically.

## 2017-08-03 NOTE — Progress Notes (Signed)
Called to room to assist during endoscopic procedure.  Patient ID and intended procedure confirmed with present staff. Received instructions for my participation in the procedure from the performing physician.  

## 2017-08-03 NOTE — Progress Notes (Signed)
Report to PACU, RN, vss, BBS= Clear.  

## 2017-08-04 ENCOUNTER — Telehealth: Payer: Self-pay | Admitting: *Deleted

## 2017-08-04 NOTE — Telephone Encounter (Signed)
  Follow up Call-  Call back number 08/03/2017  Post procedure Call Back phone  # [phone]  Permission to leave phone message Yes  Some recent data might be hidden     Patient questions:  Do you have a fever, pain , or abdominal swelling? No. Pain Score  0 *  Have you tolerated food without any problems? Yes.    Have you been able to return to your normal activities? Yes.    Do you have any questions about your discharge instructions: Diet   No. Medications  No. Follow up visit  No.  Do you have questions or concerns about your Care? No.  Actions: * If pain score is 4 or above: No action needed, pain <4.

## 2017-08-16 ENCOUNTER — Encounter: Payer: Self-pay | Admitting: Internal Medicine

## 2017-08-31 LAB — HM DIABETES EYE EXAM

## 2018-01-29 ENCOUNTER — Other Ambulatory Visit: Payer: Managed Care, Other (non HMO)

## 2018-01-30 ENCOUNTER — Ambulatory Visit (INDEPENDENT_AMBULATORY_CARE_PROVIDER_SITE_OTHER): Payer: Managed Care, Other (non HMO) | Admitting: Internal Medicine

## 2018-01-30 ENCOUNTER — Encounter: Payer: Self-pay | Admitting: Internal Medicine

## 2018-01-30 ENCOUNTER — Telehealth: Payer: Self-pay

## 2018-01-30 ENCOUNTER — Other Ambulatory Visit (INDEPENDENT_AMBULATORY_CARE_PROVIDER_SITE_OTHER): Payer: Managed Care, Other (non HMO)

## 2018-01-30 ENCOUNTER — Telehealth: Payer: Self-pay | Admitting: Internal Medicine

## 2018-01-30 VITALS — BP 132/86 | HR 70 | Temp 98.0°F | Ht 74.0 in | Wt 275.0 lb

## 2018-01-30 DIAGNOSIS — Z114 Encounter for screening for human immunodeficiency virus [HIV]: Secondary | ICD-10-CM | POA: Diagnosis not present

## 2018-01-30 DIAGNOSIS — H9122 Sudden idiopathic hearing loss, left ear: Secondary | ICD-10-CM | POA: Diagnosis not present

## 2018-01-30 DIAGNOSIS — Z0001 Encounter for general adult medical examination with abnormal findings: Secondary | ICD-10-CM | POA: Diagnosis not present

## 2018-01-30 DIAGNOSIS — I10 Essential (primary) hypertension: Secondary | ICD-10-CM

## 2018-01-30 DIAGNOSIS — E785 Hyperlipidemia, unspecified: Secondary | ICD-10-CM

## 2018-01-30 DIAGNOSIS — H6122 Impacted cerumen, left ear: Secondary | ICD-10-CM | POA: Diagnosis not present

## 2018-01-30 DIAGNOSIS — Z0000 Encounter for general adult medical examination without abnormal findings: Secondary | ICD-10-CM

## 2018-01-30 DIAGNOSIS — E119 Type 2 diabetes mellitus without complications: Secondary | ICD-10-CM

## 2018-01-30 DIAGNOSIS — Z1159 Encounter for screening for other viral diseases: Secondary | ICD-10-CM

## 2018-01-30 DIAGNOSIS — F528 Other sexual dysfunction not due to a substance or known physiological condition: Secondary | ICD-10-CM | POA: Diagnosis not present

## 2018-01-30 LAB — BASIC METABOLIC PANEL
BUN: 14 mg/dL (ref 6–23)
CALCIUM: 10.3 mg/dL (ref 8.4–10.5)
CHLORIDE: 100 meq/L (ref 96–112)
CO2: 29 meq/L (ref 19–32)
CREATININE: 1.09 mg/dL (ref 0.40–1.50)
GFR: 74.89 mL/min (ref >60.00)
Glucose, Bld: 132 mg/dL — ABNORMAL HIGH (ref 70–99)
Potassium: 4.5 mEq/L (ref 3.5–5.1)
Sodium: 137 mEq/L (ref 135–145)

## 2018-01-30 LAB — URINALYSIS, ROUTINE W REFLEX MICROSCOPIC
Bilirubin Urine: NEGATIVE
Hgb urine dipstick: NEGATIVE
Ketones, ur: NEGATIVE
Leukocytes, UA: NEGATIVE
Nitrite: NEGATIVE
PH: 7 (ref 5.0–8.0)
RBC / HPF: NONE SEEN (ref 0–?)
SPECIFIC GRAVITY, URINE: 1.01 (ref 1.000–1.030)
Total Protein, Urine: NEGATIVE
URINE GLUCOSE: NEGATIVE
Urobilinogen, UA: 0.2 (ref 0.0–1.0)
WBC UA: NONE SEEN (ref 0–?)

## 2018-01-30 LAB — HEPATIC FUNCTION PANEL
ALBUMIN: 4.7 g/dL (ref 3.5–5.2)
ALK PHOS: 64 U/L (ref 39–117)
ALT: 29 U/L (ref 0–53)
AST: 21 U/L (ref 0–37)
BILIRUBIN DIRECT: 0.1 mg/dL (ref 0.0–0.3)
TOTAL PROTEIN: 7.8 g/dL (ref 6.0–8.3)
Total Bilirubin: 0.7 mg/dL (ref 0.2–1.2)

## 2018-01-30 LAB — LIPID PANEL
Cholesterol: 191 mg/dL (ref 0–200)
HDL: 34.7 mg/dL — AB (ref >39.00)
NONHDL: 156.66
Total CHOL/HDL Ratio: 6
Triglycerides: 226 mg/dL — ABNORMAL HIGH (ref 0.0–149.0)
VLDL: 45.2 mg/dL — ABNORMAL HIGH (ref 0.0–40.0)

## 2018-01-30 LAB — CBC WITH DIFFERENTIAL/PLATELET
BASOS ABS: 0.1 10*3/uL (ref 0.0–0.1)
Basophils Relative: 1.2 % (ref 0.0–3.0)
EOS ABS: 0.2 10*3/uL (ref 0.0–0.7)
Eosinophils Relative: 2.3 % (ref 0.0–5.0)
HEMATOCRIT: 48.7 % (ref 39.0–52.0)
Hemoglobin: 16.7 g/dL (ref 13.0–17.0)
LYMPHS PCT: 30.1 % (ref 12.0–46.0)
Lymphs Abs: 2.4 10*3/uL (ref 0.7–4.0)
MCHC: 34.3 g/dL (ref 30.0–36.0)
MCV: 85.4 fl (ref 78.0–100.0)
MONOS PCT: 7.7 % (ref 3.0–12.0)
Monocytes Absolute: 0.6 10*3/uL (ref 0.1–1.0)
NEUTROS PCT: 58.7 % (ref 43.0–77.0)
Neutro Abs: 4.7 10*3/uL (ref 1.4–7.7)
Platelets: 241 10*3/uL (ref 150.0–400.0)
RBC: 5.7 Mil/uL (ref 4.22–5.81)
RDW: 13.9 % (ref 11.5–15.5)
WBC: 7.9 10*3/uL (ref 4.0–10.5)

## 2018-01-30 LAB — MICROALBUMIN / CREATININE URINE RATIO
CREATININE, U: 96.9 mg/dL
Microalb Creat Ratio: 0.7 mg/g (ref 0.0–30.0)

## 2018-01-30 LAB — PSA: PSA: 0.59 ng/mL (ref 0.10–4.00)

## 2018-01-30 LAB — HEMOGLOBIN A1C: Hgb A1c MFr Bld: 5.9 % (ref 4.6–6.5)

## 2018-01-30 LAB — TSH: TSH: 3.93 u[IU]/mL (ref 0.35–4.50)

## 2018-01-30 LAB — LDL CHOLESTEROL, DIRECT: LDL DIRECT: 107 mg/dL

## 2018-01-30 MED ORDER — SILDENAFIL CITRATE 100 MG PO TABS: 50.0000 mg | ORAL_TABLET | Freq: Every day | ORAL | 11 refills | Status: DC | PRN

## 2018-01-30 NOTE — Telephone Encounter (Signed)
Copied from [place] [id number]. Topic: Inquiry >> Jan 30, 2018 12:24 PM William [name] wrote: Reason for CRM: pt states he spoke w/ a nurse @ his visit today regarding viagra, and is requesting to be prescribed the medication, contact pt to advise

## 2018-01-30 NOTE — Telephone Encounter (Signed)
Pt has been informed  Script at the front desk

## 2018-01-30 NOTE — Progress Notes (Signed)
Subjective:    Patient ID: William [name], male    DOB: 1966-05-05, 52 y.o.   MRN: [id number]  HPI  Here for wellness and f/u;  Overall doing ok;  Pt denies Chest pain, worsening SOB, DOE, wheezing, orthopnea, PND, worsening LE edema, palpitations, dizziness or syncope.  Pt denies neurological change such as new headache, facial or extremity weakness.  Pt denies polydipsia, polyuria, or low sugar symptoms. Pt states overall good compliance with treatment and medications, good tolerability, and has been trying to follow appropriate diet.  Pt denies worsening depressive symptoms, suicidal ideation or panic. No fever, night sweats, wt loss, loss of appetite, or other constitutional symptoms.  Pt states good ability with ADL's, has low fall risk, home safety reviewed and adequate, no other significant changes in hearing or vision, and only occasionally active with exercise. BP Readings from Last 3 Encounters:  01/30/18 132/86  08/03/17 109/70  01/26/17 (!) 170/110  Wt down 5 lbs with better diet, trying to get the sugar down to avoid OHA start. Has been chronic overeater and trying to reform.  Also has been more active in summer, but still walks in the winter.     Wt Readings from Last 3 Encounters:  01/30/18 275 lb (124.7 kg)  08/03/17 280 lb (127 kg)  07/19/17 280 lb (127 kg)  Declines tdap and pneumovax and shingrix. No other new complaints or interval hx except: Has 1 wk left hearing loss without pain, fever, HA or sinus symptoms.  Also with 6 mo worsening ED symptoms, has not tried viagra in past, asks for rx Past Medical History:  Diagnosis Date  . Anxiety   . Degenerative arthritis of lumbar spine 12/12/2013  . Diverticulitis   . Encounter for well adult exam with abnormal findings 07/12/2013  . GERD (gastroesophageal reflux disease)   . Hypertension   . Sleep apnea    Past Surgical History:  Procedure Laterality Date  . COLONOSCOPY    . WISDOM TOOTH EXTRACTION      reports that  has  never smoked. he has never used smokeless tobacco. He reports that he drinks alcohol. He reports that he does not use drugs. family history includes Cancer in his mother; Hyperlipidemia in his mother. Allergies  Allergen Reactions  . Ivp Dye [Iodinated Diagnostic Agents] Hives    Per pt after CT with IV contrast aug 2014  . Niacin    Current Outpatient Medications on File Prior to Visit  Medication Sig Dispense Refill  . Cholecalciferol (VITAMIN D PO) Take by mouth.    . Magnesium Hydroxide (MAGNESIA PO) Take by mouth.     No current facility-administered medications on file prior to visit.    Review of Systems Constitutional: Negative for other unusual diaphoresis, sweats, appetite or weight changes HENT: Negative for other worsening hearing loss, ear pain, facial swelling, mouth sores or neck stiffness.   Eyes: Negative for other worsening pain, redness or other visual disturbance.  Respiratory: Negative for other stridor or swelling Cardiovascular: Negative for other palpitations or other chest pain  Gastrointestinal: Negative for worsening diarrhea or loose stools, blood in stool, distention or other pain Genitourinary: Negative for hematuria, flank pain or other change in urine volume.  Musculoskeletal: Negative for myalgias or other joint swelling.  Skin: Negative for other color change, or other wound or worsening drainage.  Neurological: Negative for other syncope or numbness. Hematological: Negative for other adenopathy or swelling Psychiatric/Behavioral: Negative for hallucinations, other worsening agitation, SI, self-injury, or  new decreased concentration All other system neg per pt    Objective:   Physical Exam BP 132/86   Pulse 70   Temp 98 F (36.7 C) (Oral)   Ht 6\' 2"  (1.88 m)   Wt 275 lb (124.7 kg)   SpO2 95%   BMI 35.31 kg/m  VS noted,  Constitutional: Pt is oriented to person, place, and time. Appears well-developed and well-nourished, in no significant  distress and comfortable Head: Normocephalic and atraumatic  Eyes: Conjunctivae and EOM are normal. Pupils are equal, round, and reactive to light Right Ear: External ear normal without discharge Left Ear: External ear normal without discharge Nose: Nose without discharge or deformity Mouth/Throat: Oropharynx is without other ulcerations and moist  Neck: Normal range of motion. Neck supple. No JVD present. No tracheal deviation present or significant neck LA or mass Cardiovascular: Normal rate, regular rhythm, normal heart sounds and intact distal pulses.   Pulmonary/Chest: WOB normal and breath sounds without rales or wheezing  Abdominal: Soft. Bowel sounds are normal. NT. No HSM  Musculoskeletal: Normal range of motion. Exhibits no edema Lymphadenopathy: Has no other cervical adenopathy.  Neurological: Pt is alert and oriented to person, place, and time. Pt has normal reflexes. No cranial nerve deficit. Motor grossly intact, Gait intact Skin: Skin is warm and dry. No rash noted or new ulcerations Psychiatric:  Has normal mood and affect. Behavior is normal without agitation No other exam findings  ECG today I have personally interpreted NSR - no ischemic changes    Assessment & Plan:

## 2018-01-30 NOTE — Telephone Encounter (Signed)
-----   Message from Biagio Borg, MD sent at 01/30/2018 12:48 PM EST ----- Done hardcopy to you ----- Message ----- From: Juliet Rude, CMA Sent: 01/30/2018  10:55 AM To: Biagio Borg, MD  Pt would like a paper script for generic viagra. Please advise.

## 2018-01-30 NOTE — Patient Instructions (Signed)
Your EKG was [facility] today  Please continue all other medications as before, and refills have been done if requested.  Please have the pharmacy call with any other refills you may need.  Please continue your efforts at being more active, low cholesterol diet, and weight control.  You are otherwise up to date with prevention measures today.  Please keep your appointments with your specialists as you may have planned  Please go to the LAB in the Basement (turn left off the elevator) for the tests to be done today  You will be contacted by phone if any changes need to be made immediately.  Otherwise, you will receive a letter about your results with an explanation, but please check with MyChart first.  Please remember to sign up for MyChart if you have not done so, as this will be important to you in the future with finding out test results, communicating by private email, and scheduling acute appointments online when needed.  Please return in 6 months, or sooner if needed, with Lab testing done 3-5 days before

## 2018-01-31 LAB — HEPATITIS C ANTIBODY
Hepatitis C Ab: NONREACTIVE
SIGNAL TO CUT-OFF: 0.02 (ref <1.00)

## 2018-01-31 LAB — HIV ANTIBODY (ROUTINE TESTING W REFLEX): HIV: NONREACTIVE

## 2018-02-01 ENCOUNTER — Telehealth: Payer: Self-pay | Admitting: *Deleted

## 2018-02-01 DIAGNOSIS — H9122 Sudden idiopathic hearing loss, left ear: Secondary | ICD-10-CM | POA: Insufficient documentation

## 2018-02-01 NOTE — Assessment & Plan Note (Signed)
Lab Results  Component Value Date   LDLCALC 84 12/20/2016  stable overall by history and exam, recent data reviewed with pt, and pt to continue medical treatment as before,  to f/u any worsening symptoms or concerns

## 2018-02-01 NOTE — Assessment & Plan Note (Signed)
With impacted wax, unable to irrigate well, for ENT referral

## 2018-02-01 NOTE — Assessment & Plan Note (Signed)
stable overall by history and exam, recent data reviewed with pt, and pt to continue medical treatment as before,  to f/u any worsening symptoms or concerns BP Readings from Last 3 Encounters:  01/30/18 132/86  08/03/17 109/70  01/26/17 (!) 170/110

## 2018-02-01 NOTE — Telephone Encounter (Signed)
Patient request to have medical records from [facility],upon arrival the records will be sent to [facility] record request faxed.

## 2018-02-01 NOTE — Assessment & Plan Note (Signed)

## 2018-02-01 NOTE — Assessment & Plan Note (Signed)
Ok for viagra prn

## 2018-02-01 NOTE — Assessment & Plan Note (Signed)
stable overall by history and exam, recent data reviewed with pt, and pt to continue medical treatment as before,  to f/u any worsening symptoms or concerns Lab Results  Component Value Date   HGBA1C 5.9 01/30/2018

## 2018-06-18 ENCOUNTER — Telehealth: Payer: Self-pay

## 2018-06-18 MED ORDER — METRONIDAZOLE 500 MG PO TABS: 500.0000 mg | ORAL_TABLET | Freq: Three times a day (TID) | ORAL | 0 refills | Status: DC

## 2018-06-18 MED ORDER — CIPROFLOXACIN HCL 500 MG PO TABS: 500.0000 mg | ORAL_TABLET | Freq: Two times a day (BID) | ORAL | 0 refills | Status: DC

## 2018-06-18 NOTE — Telephone Encounter (Signed)
-----   Message from Willia Craze, NP sent at 06/18/2018  3:41 PM EDT ----- William [name], patient has a hx of diverticulitis. Last episode occurred while on a cruise. He is nervous about leaving town for 10 days without antibiotics on hand in case he gets a flare. Would you mind calling in cipro 500 mg BID and flagyl 500 tid both for 7 days to carry with him in case of flare?  I think he uses [facility] on [address]. Thanks

## 2018-06-18 NOTE — Telephone Encounter (Signed)
Tried to reach patient to confirm pharmacy. No answer on his listed phone. Mrs. Camilo's number is disconnected.  Rx to his listed pharmacy [facility] [place].

## 2018-07-30 ENCOUNTER — Other Ambulatory Visit (INDEPENDENT_AMBULATORY_CARE_PROVIDER_SITE_OTHER): Payer: Managed Care, Other (non HMO)

## 2018-07-30 DIAGNOSIS — E119 Type 2 diabetes mellitus without complications: Secondary | ICD-10-CM

## 2018-07-30 LAB — HEPATIC FUNCTION PANEL
ALK PHOS: 61 U/L (ref 39–117)
ALT: 35 U/L (ref 0–53)
AST: 33 U/L (ref 0–37)
Albumin: 4.5 g/dL (ref 3.5–5.2)
BILIRUBIN DIRECT: 0.2 mg/dL (ref 0.0–0.3)
BILIRUBIN TOTAL: 0.8 mg/dL (ref 0.2–1.2)
TOTAL PROTEIN: 7.5 g/dL (ref 6.0–8.3)

## 2018-07-30 LAB — LIPID PANEL
CHOLESTEROL: 136 mg/dL (ref 0–200)
HDL: 34 mg/dL — ABNORMAL LOW (ref >39.00)
LDL Cholesterol: 78 mg/dL (ref 0–99)
NONHDL: 101.69
Total CHOL/HDL Ratio: 4
Triglycerides: 118 mg/dL (ref 0.0–149.0)
VLDL: 23.6 mg/dL (ref 0.0–40.0)

## 2018-07-30 LAB — BASIC METABOLIC PANEL
BUN: 15 mg/dL (ref 6–23)
CALCIUM: 9.4 mg/dL (ref 8.4–10.5)
CO2: 28 mEq/L (ref 19–32)
CREATININE: 1.13 mg/dL (ref 0.40–1.50)
Chloride: 102 mEq/L (ref 96–112)
GFR: 71.7 mL/min (ref >60.00)
GLUCOSE: 120 mg/dL — AB (ref 70–99)
Potassium: 4.1 mEq/L (ref 3.5–5.1)
Sodium: 137 mEq/L (ref 135–145)

## 2018-07-30 LAB — HEMOGLOBIN A1C: HEMOGLOBIN A1C: 6 % (ref 4.6–6.5)

## 2018-07-31 ENCOUNTER — Ambulatory Visit: Payer: Managed Care, Other (non HMO) | Admitting: Internal Medicine

## 2018-07-31 VITALS — BP 138/86 | HR 69 | Temp 98.3°F | Ht 74.0 in | Wt 286.0 lb

## 2018-07-31 DIAGNOSIS — E119 Type 2 diabetes mellitus without complications: Secondary | ICD-10-CM | POA: Diagnosis not present

## 2018-07-31 DIAGNOSIS — F411 Generalized anxiety disorder: Secondary | ICD-10-CM

## 2018-07-31 DIAGNOSIS — Z0000 Encounter for general adult medical examination without abnormal findings: Secondary | ICD-10-CM

## 2018-07-31 DIAGNOSIS — E785 Hyperlipidemia, unspecified: Secondary | ICD-10-CM | POA: Diagnosis not present

## 2018-07-31 DIAGNOSIS — I10 Essential (primary) hypertension: Secondary | ICD-10-CM

## 2018-07-31 MED ORDER — CLONAZEPAM 0.5 MG PO TABS: 0.5000 mg | ORAL_TABLET | Freq: Two times a day (BID) | ORAL | 1 refills | Status: DC | PRN

## 2018-07-31 MED ORDER — SILDENAFIL CITRATE 100 MG PO TABS: 50.0000 mg | ORAL_TABLET | Freq: Every day | ORAL | 11 refills | Status: DC | PRN

## 2018-07-31 NOTE — Assessment & Plan Note (Signed)
stable overall by history and exam, recent data reviewed with pt, and pt to continue medical treatment as before,  to f/u any worsening symptoms or concerns Lab Results  Component Value Date   HGBA1C 6.0 07/30/2018

## 2018-07-31 NOTE — Patient Instructions (Signed)
Please take all new medication as prescribed - the klonopin as needed for nerves  Please continue all other medications as before, and refills have been done if requested - viagra  Please have the pharmacy call with any other refills you may need.  Please continue your efforts at being more active, low cholesterol diet, and weight control..  Please keep your appointments with your specialists as you may have planned  Please return in 6 months, or sooner if needed, with Lab testing done 3-5 days before

## 2018-07-31 NOTE — Assessment & Plan Note (Signed)
Ok for klonopin bid prn, declines referral for counseling or psychiatry referral

## 2018-07-31 NOTE — Assessment & Plan Note (Signed)
stable overall by history and exam, recent data reviewed with pt, and pt to continue medical treatment as before,  to f/u any worsening symptoms or concerns Lab Results  Component Value Date   LDLCALC 78 07/30/2018

## 2018-07-31 NOTE — Progress Notes (Signed)
Subjective:    Patient ID: William [name], male    DOB: [birth year], 52 y.o.   MRN: [id number]  HPI  Here to f/u; overall doing ok,  Pt denies chest pain, increasing sob or doe, wheezing, orthopnea, PND, increased LE swelling, palpitations, dizziness or syncope.  Pt denies new neurological symptoms such as new headache, or facial or extremity weakness or numbness.  Pt denies polydipsia, polyuria, or low sugar episode.  Pt states overall good compliance with meds, mostly trying to follow appropriate diet, with wt overall stable,  but little exercise however.  Pt continues to have recurring right LBP without change in severity, bowel or bladder change, fever, wt loss,  worsening LE pain/numbness/weakness, gait change or falls.  Gained significant wt with stress eating per pt, trying to follow a better diet and be more more active, and just back from the cruise.   Wt Readings from Last 3 Encounters:  07/31/18 286 lb (129.7 kg)  01/30/18 275 lb (124.7 kg)  08/03/17 280 lb (127 kg)  Denies worsening depressive symptoms, suicidal ideation, or panic; has ongoing anxiety, asks for klonopin tx.  Also with persistent ED symptoms , asks for viagra refill Past Medical History:  Diagnosis Date  . Anxiety   . Degenerative arthritis of lumbar spine 12/12/2013  . Diverticulitis   . Encounter for well adult exam with abnormal findings 07/12/2013  . GERD (gastroesophageal reflux disease)   . Hypertension   . Sleep apnea    Past Surgical History:  Procedure Laterality Date  . COLONOSCOPY    . WISDOM TOOTH EXTRACTION      reports that he has never smoked. He has never used smokeless tobacco. He reports that he drinks alcohol. He reports that he does not use drugs. family history includes Cancer in his mother; Hyperlipidemia in his mother. Allergies  Allergen Reactions  . Ivp Dye [Iodinated Diagnostic Agents] Hives    Per pt after CT with IV contrast aug 2014  . Niacin    Current Outpatient Medications on  File Prior to Visit  Medication Sig Dispense Refill  . Cholecalciferol (VITAMIN D PO) Take by mouth.    . Magnesium Hydroxide (MAGNESIA PO) Take by mouth.     No current facility-administered medications on file prior to visit.    Review of Systems  Constitutional: Negative for other unusual diaphoresis or sweats HENT: Negative for ear discharge or swelling Eyes: Negative for other worsening visual disturbances Respiratory: Negative for stridor or other swelling  Gastrointestinal: Negative for worsening distension or other blood Genitourinary: Negative for retention or other urinary change Musculoskeletal: Negative for other MSK pain or swelling Skin: Negative for color change or other new lesions Neurological: Negative for worsening tremors and other numbness  Psychiatric/Behavioral: Negative for worsening agitation or other fatigue All other system neg per pt    Objective:   Physical Exam BP 138/86   Pulse 69   Temp 98.3 F (36.8 C) (Oral)   Ht 6\' 2"  (1.88 m)   Wt 286 lb (129.7 kg)   SpO2 97%   BMI 36.72 kg/m  VS noted,  Constitutional: Pt appears in NAD HENT: Head: NCAT.  Right Ear: External ear normal.  Left Ear: External ear normal.  Eyes: . Pupils are equal, round, and reactive to light. Conjunctivae and EOM are normal Nose: without d/c or deformity Neck: Neck supple. Gross normal ROM Cardiovascular: Normal rate and regular rhythm.   Pulmonary/Chest: Effort normal and breath sounds without rales or wheezing.  Abd:  Soft, NT, ND, + BS, no organomegaly\Spine nontender + right lumbar paravertebral tender spasm Neurological: Pt is alert. At baseline orientation, motor grossly intact Skin: Skin is warm. No rashes, other new lesions, no LE edema Psychiatric: Pt behavior is normal without agitation , 2+ nervous No other exam findings Lab Results  Component Value Date   WBC 7.9 01/30/2018   HGB 16.7 01/30/2018   HCT 48.7 01/30/2018   PLT 241.0 01/30/2018   GLUCOSE  120 (H) 07/30/2018   CHOL 136 07/30/2018   TRIG 118.0 07/30/2018   HDL 34.00 (L) 07/30/2018   LDLDIRECT 107.0 01/30/2018   LDLCALC 78 07/30/2018   ALT 35 07/30/2018   AST 33 07/30/2018   NA 137 07/30/2018   K 4.1 07/30/2018   CL 102 07/30/2018   CREATININE 1.13 07/30/2018   BUN 15 07/30/2018   CO2 28 07/30/2018   TSH 3.93 01/30/2018   PSA 0.59 01/30/2018   HGBA1C 6.0 07/30/2018   MICROALBUR <0.7 01/30/2018       Assessment & Plan:

## 2018-07-31 NOTE — Assessment & Plan Note (Signed)
stable overall by history and exam, recent data reviewed with pt, and pt to continue medical treatment as before,  to f/u any worsening symptoms or concerns BP Readings from Last 3 Encounters:  07/31/18 138/86  01/30/18 132/86  08/03/17 109/70

## 2019-02-01 ENCOUNTER — Encounter: Payer: Self-pay | Admitting: Internal Medicine

## 2019-03-26 ENCOUNTER — Telehealth: Payer: Self-pay

## 2019-03-26 DIAGNOSIS — Z0000 Encounter for general adult medical examination without abnormal findings: Secondary | ICD-10-CM

## 2019-03-26 NOTE — Telephone Encounter (Signed)
[place] for nurse visit for EKG - dx HTN

## 2019-03-26 NOTE — Telephone Encounter (Signed)
Pt is traveling from [facility] and would like to know if he could have EKG done when he comes for lab work on Thursday and keep his video visit on Friday. Please advise.

## 2019-03-27 NOTE — Telephone Encounter (Signed)
Called pt, LVM stating that it would be ok for him to have EKG done the same day he comes for his lab work.

## 2019-03-27 NOTE — Telephone Encounter (Signed)
Patient has been scheduled for 4/23 at 9:40 to have ekg --per dr Jenny Reichmann, ok to do in office---patient also going down for lab work, will have virtual visit on Friday with dr john--not sure what time patient will arrive for ekg on 4/23, but he has appt time for 9:40am, advised scheduling to let shirron know when patient arrives, do not allow a "no show" fee to occur

## 2019-03-28 ENCOUNTER — Ambulatory Visit (INDEPENDENT_AMBULATORY_CARE_PROVIDER_SITE_OTHER): Payer: Managed Care, Other (non HMO) | Admitting: Internal Medicine

## 2019-03-28 ENCOUNTER — Other Ambulatory Visit (INDEPENDENT_AMBULATORY_CARE_PROVIDER_SITE_OTHER): Payer: Managed Care, Other (non HMO)

## 2019-03-28 ENCOUNTER — Other Ambulatory Visit: Payer: Self-pay

## 2019-03-28 DIAGNOSIS — Z125 Encounter for screening for malignant neoplasm of prostate: Secondary | ICD-10-CM | POA: Diagnosis not present

## 2019-03-28 DIAGNOSIS — Z0000 Encounter for general adult medical examination without abnormal findings: Secondary | ICD-10-CM | POA: Diagnosis not present

## 2019-03-28 DIAGNOSIS — I10 Essential (primary) hypertension: Secondary | ICD-10-CM

## 2019-03-28 LAB — URINALYSIS, ROUTINE W REFLEX MICROSCOPIC
Bilirubin Urine: NEGATIVE
Hgb urine dipstick: NEGATIVE
Ketones, ur: NEGATIVE
Leukocytes,Ua: NEGATIVE
Nitrite: NEGATIVE
RBC / HPF: NONE SEEN (ref 0–?)
Specific Gravity, Urine: 1.01 (ref 1.000–1.030)
Total Protein, Urine: NEGATIVE
Urine Glucose: NEGATIVE
Urobilinogen, UA: 0.2 (ref 0.0–1.0)
WBC, UA: NONE SEEN (ref 0–?)
pH: 7.5 (ref 5.0–8.0)

## 2019-03-28 LAB — HEPATIC FUNCTION PANEL
ALT: 52 U/L (ref 0–53)
AST: 35 U/L (ref 0–37)
Albumin: 4.9 g/dL (ref 3.5–5.2)
Alkaline Phosphatase: 62 U/L (ref 39–117)
Bilirubin, Direct: 0.2 mg/dL (ref 0.0–0.3)
Total Bilirubin: 0.8 mg/dL (ref 0.2–1.2)
Total Protein: 7.5 g/dL (ref 6.0–8.3)

## 2019-03-28 LAB — CBC WITH DIFFERENTIAL/PLATELET
Basophils Absolute: 0.1 10*3/uL (ref 0.0–0.1)
Basophils Relative: 0.8 % (ref 0.0–3.0)
Eosinophils Absolute: 0.1 10*3/uL (ref 0.0–0.7)
Eosinophils Relative: 1.5 % (ref 0.0–5.0)
HCT: 49.2 % (ref 39.0–52.0)
Hemoglobin: 17.3 g/dL — ABNORMAL HIGH (ref 13.0–17.0)
Lymphocytes Relative: 29.5 % (ref 12.0–46.0)
Lymphs Abs: 2.6 10*3/uL (ref 0.7–4.0)
MCHC: 35.1 g/dL (ref 30.0–36.0)
MCV: 86.3 fl (ref 78.0–100.0)
Monocytes Absolute: 0.7 10*3/uL (ref 0.1–1.0)
Monocytes Relative: 7.7 % (ref 3.0–12.0)
Neutro Abs: 5.3 10*3/uL (ref 1.4–7.7)
Neutrophils Relative %: 60.5 % (ref 43.0–77.0)
Platelets: 181 10*3/uL (ref 150.0–400.0)
RBC: 5.71 Mil/uL (ref 4.22–5.81)
RDW: 13.9 % (ref 11.5–15.5)
WBC: 8.7 10*3/uL (ref 4.0–10.5)

## 2019-03-28 LAB — LIPID PANEL
Cholesterol: 184 mg/dL (ref 0–200)
HDL: 34.9 mg/dL — ABNORMAL LOW (ref >39.00)
NonHDL: 149.27
Total CHOL/HDL Ratio: 5
Triglycerides: 362 mg/dL — ABNORMAL HIGH (ref 0.0–149.0)
VLDL: 72.4 mg/dL — ABNORMAL HIGH (ref 0.0–40.0)

## 2019-03-28 LAB — BASIC METABOLIC PANEL
BUN: 11 mg/dL (ref 6–23)
CO2: 29 mEq/L (ref 19–32)
Calcium: 9.6 mg/dL (ref 8.4–10.5)
Chloride: 100 mEq/L (ref 96–112)
Creatinine, Ser: 1.03 mg/dL (ref 0.40–1.50)
GFR: 74.89 mL/min (ref >60.00)
Glucose, Bld: 122 mg/dL — ABNORMAL HIGH (ref 70–99)
Potassium: 4.6 mEq/L (ref 3.5–5.1)
Sodium: 139 mEq/L (ref 135–145)

## 2019-03-28 LAB — TSH: TSH: 3.21 u[IU]/mL (ref 0.35–4.50)

## 2019-03-28 LAB — PSA: PSA: 0.41 ng/mL (ref 0.10–4.00)

## 2019-03-28 LAB — LDL CHOLESTEROL, DIRECT: Direct LDL: 80 mg/dL

## 2019-03-28 NOTE — Progress Notes (Signed)
Pt not seen, has OV tomorrow

## 2019-03-28 NOTE — Patient Instructions (Signed)
error 

## 2019-03-29 ENCOUNTER — Ambulatory Visit (INDEPENDENT_AMBULATORY_CARE_PROVIDER_SITE_OTHER): Payer: Managed Care, Other (non HMO) | Admitting: Internal Medicine

## 2019-03-29 ENCOUNTER — Encounter: Payer: Self-pay | Admitting: Internal Medicine

## 2019-03-29 DIAGNOSIS — Z0001 Encounter for general adult medical examination with abnormal findings: Secondary | ICD-10-CM | POA: Diagnosis not present

## 2019-03-29 DIAGNOSIS — E119 Type 2 diabetes mellitus without complications: Secondary | ICD-10-CM | POA: Diagnosis not present

## 2019-03-29 DIAGNOSIS — H6122 Impacted cerumen, left ear: Secondary | ICD-10-CM | POA: Diagnosis not present

## 2019-03-29 DIAGNOSIS — F411 Generalized anxiety disorder: Secondary | ICD-10-CM

## 2019-03-29 DIAGNOSIS — F339 Major depressive disorder, recurrent, unspecified: Secondary | ICD-10-CM | POA: Diagnosis not present

## 2019-03-29 MED ORDER — CITALOPRAM HYDROBROMIDE 20 MG PO TABS: 20.0000 mg | ORAL_TABLET | Freq: Every day | ORAL | 3 refills | Status: DC

## 2019-03-29 MED ORDER — LORAZEPAM 1 MG PO TABS: ORAL_TABLET | ORAL | 2 refills | Status: DC

## 2019-03-29 NOTE — Assessment & Plan Note (Signed)
Klonopin too sedating, ok to change to lorazepam prn,  to f/u any worsening symptoms or concerns

## 2019-03-29 NOTE — Assessment & Plan Note (Signed)
stable overall by history and exam, recent data reviewed with pt, and pt to continue medical treatment as before,  to f/u any worsening symptoms or concerns  

## 2019-03-29 NOTE — Assessment & Plan Note (Addendum)
Ok for ent referral near his new home in [place]  In addition to the time spent performing CPE, I spent an additional 25 minutes face to face,in which greater than 50% of this time was spent in counseling and coordination of care for patient's acute illness as documented, including the differential dx, treatment, further evaluation and other management of left ear wax impaction, DM, depression and anxiety

## 2019-03-29 NOTE — Progress Notes (Signed)
Patient ID: William [name], male   DOB: Feb 13, 1966, 53 y.o.   MRN: [id number]  Virtual Visit via Video Note  I connected with William [name] on 03/29/19 at  1:20 PM EDT by a video enabled telemedicine application and verified that I am speaking with the correct person using two identifiers. Pt is at his new home in [facility] (close to [place]) after started a new job a few months ago, but now has been furloughed and not sure of the future.     I discussed the limitations of evaluation and management by telemedicine and the availability of in person appointments. The patient expressed understanding and agreed to proceed.  History of Present Illness: Here for wellness and f/u;  Overall doing ok;  Pt denies Chest pain, worsening SOB, DOE, wheezing, orthopnea, PND, worsening LE edema, palpitations, dizziness or syncope.  Pt denies neurological change such as new headache, facial or extremity weakness.  Pt denies polydipsia, polyuria, or low sugar symptoms. Pt states overall good compliance with treatment and medications, good tolerability, and has been trying to follow appropriate diet.. No fever, night sweats, wt loss, loss of appetite, or other constitutional symptoms.  Pt states good ability with ADL's, has low fall risk, home safety reviewed and adequate, no other significant changes in hearing or vision, and only occasionally active with exercise. Due for prevnar 13.  Has gained about 16 lbs per pt in the last month on shelter in place.  Normally sees Dr Macarthur Critchley locally for yearly eye exams, plans to return soon Also had left ear hearing loss without pain or HA or fever, similar to previous wax impaction, asks for referral to ENT.    Also with worsening depression x 2-3 mo with sadness, but no HI or SI, asks for new tx.  Anxiety high as well given his social situation, but has sedation too much with even lower dose klonopin. Past Medical History:  Diagnosis Date  . Anxiety   . Degenerative  arthritis of lumbar spine 12/12/2013  . Diverticulitis   . Encounter for well adult exam with abnormal findings 07/12/2013  . GERD (gastroesophageal reflux disease)   . Hypertension   . Sleep apnea    Past Surgical History:  Procedure Laterality Date  . COLONOSCOPY    . WISDOM TOOTH EXTRACTION      reports that he has never smoked. He has never used smokeless tobacco. He reports current alcohol use. He reports that he does not use drugs. family history includes Cancer in his mother; Hyperlipidemia in his mother. Allergies  Allergen Reactions  . Ivp Dye [Iodinated Diagnostic Agents] Hives    Per pt after CT with IV contrast aug 2014  . Niacin    Current Outpatient Medications on File Prior to Visit  Medication Sig Dispense Refill  . Cholecalciferol (VITAMIN D PO) Take by mouth.    . Magnesium Hydroxide (MAGNESIA PO) Take by mouth.    . sildenafil (VIAGRA) 100 MG tablet Take 0.5-1 tablets (50-100 mg total) by mouth daily as needed for erectile dysfunction. 5 tablet 11   No current facility-administered medications on file prior to visit.    Observations/Objective: Alert, NAD, + nervous depressed affect, cn 2-12 intact, resps normal, moves all 4s, no visible swelling or rash Lab Results  Component Value Date   WBC 8.7 03/28/2019   HGB 17.3 (H) 03/28/2019   HCT 49.2 03/28/2019   PLT 181.0 03/28/2019   GLUCOSE 122 (H) 03/28/2019   CHOL 184 03/28/2019  TRIG 362.0 (H) 03/28/2019   HDL 34.90 (L) 03/28/2019   LDLDIRECT 80.0 03/28/2019   LDLCALC 78 07/30/2018   ALT 52 03/28/2019   AST 35 03/28/2019   NA 139 03/28/2019   K 4.6 03/28/2019   CL 100 03/28/2019   CREATININE 1.03 03/28/2019   BUN 11 03/28/2019   CO2 29 03/28/2019   TSH 3.21 03/28/2019   PSA 0.41 03/28/2019   HGBA1C 6.0 07/30/2018   MICROALBUR <0.7 01/30/2018   ECG I personally interpreted from 4/23 with NSR   Assessment and Plan:   Follow Up Instructions:    I discussed the assessment and treatment plan  with the patient. The patient was provided an opportunity to ask questions and all were answered. The patient agreed with the plan and demonstrated an understanding of the instructions.   The patient was advised to call back or seek an in-person evaluation if the symptoms worsen or if the condition fails to improve as anticipated.   Cathlean Cower, MD

## 2019-03-29 NOTE — Patient Instructions (Signed)
Please take all new medication as prescribed  - the celexa 20 mg per day  Your After Visit Summary and recent labs are sent to you  Please remember to have a Prevnar 13 (pneumonia) shot done at your local pharmacy  OK to stop the klonopin (clonazepam)  Please take all new medication as prescribed - the Lorazepam instead  Please continue all other medications as before, and refills have been done if requested.  Please have the pharmacy call with any other refills you may need.  Please continue your efforts at being more active, low cholesterol diet, and weight control.  You are otherwise up to date with prevention measures today.  Please keep your appointments with your specialists as you may have planned including your yearly eye exam with Dr Luretha Rued.    You will be contacted regarding the referral for: ENT near [facility]  Please return in 6 months, or sooner if needed, with Lab testing done 3-5 days before

## 2019-03-29 NOTE — Assessment & Plan Note (Signed)

## 2019-03-29 NOTE — Assessment & Plan Note (Signed)
Ok for celexa 20 mg qd, [facility] psychiatric referral

## 2020-02-18 ENCOUNTER — Telehealth: Payer: Self-pay

## 2020-02-18 ENCOUNTER — Telehealth: Payer: Self-pay | Admitting: Internal Medicine

## 2020-02-18 DIAGNOSIS — Z0000 Encounter for general adult medical examination without abnormal findings: Secondary | ICD-10-CM

## 2020-02-18 DIAGNOSIS — E559 Vitamin D deficiency, unspecified: Secondary | ICD-10-CM

## 2020-02-18 DIAGNOSIS — E538 Deficiency of other specified B group vitamins: Secondary | ICD-10-CM

## 2020-02-18 DIAGNOSIS — E611 Iron deficiency: Secondary | ICD-10-CM

## 2020-02-18 DIAGNOSIS — E119 Type 2 diabetes mellitus without complications: Secondary | ICD-10-CM

## 2020-02-18 NOTE — Telephone Encounter (Signed)
New message    The patient calling asking for labs prior to appt

## 2020-02-18 NOTE — Telephone Encounter (Signed)
Ok labs ordered 

## 2020-02-20 NOTE — Telephone Encounter (Signed)
Left message for patient to return call to clinic and set up lab appointment a few days prior to his appointment with Dr. Jenny Reichmann.

## 2020-03-27 ENCOUNTER — Other Ambulatory Visit (INDEPENDENT_AMBULATORY_CARE_PROVIDER_SITE_OTHER): Payer: Managed Care, Other (non HMO)

## 2020-03-27 DIAGNOSIS — E611 Iron deficiency: Secondary | ICD-10-CM

## 2020-03-27 DIAGNOSIS — E119 Type 2 diabetes mellitus without complications: Secondary | ICD-10-CM | POA: Diagnosis not present

## 2020-03-27 DIAGNOSIS — Z0000 Encounter for general adult medical examination without abnormal findings: Secondary | ICD-10-CM | POA: Diagnosis not present

## 2020-03-27 DIAGNOSIS — E559 Vitamin D deficiency, unspecified: Secondary | ICD-10-CM

## 2020-03-27 DIAGNOSIS — E538 Deficiency of other specified B group vitamins: Secondary | ICD-10-CM | POA: Diagnosis not present

## 2020-03-27 LAB — CBC WITH DIFFERENTIAL/PLATELET
Basophils Absolute: 0.1 10*3/uL (ref 0.0–0.1)
Basophils Relative: 1.5 % (ref 0.0–3.0)
Eosinophils Absolute: 0.1 10*3/uL (ref 0.0–0.7)
Eosinophils Relative: 2 % (ref 0.0–5.0)
HCT: 48.1 % (ref 39.0–52.0)
Hemoglobin: 16.6 g/dL (ref 13.0–17.0)
Lymphocytes Relative: 38.4 % (ref 12.0–46.0)
Lymphs Abs: 2.9 10*3/uL (ref 0.7–4.0)
MCHC: 34.5 g/dL (ref 30.0–36.0)
MCV: 87.2 fl (ref 78.0–100.0)
Monocytes Absolute: 0.6 10*3/uL (ref 0.1–1.0)
Monocytes Relative: 7.5 % (ref 3.0–12.0)
Neutro Abs: 3.8 10*3/uL (ref 1.4–7.7)
Neutrophils Relative %: 50.6 % (ref 43.0–77.0)
Platelets: 169 10*3/uL (ref 150.0–400.0)
RBC: 5.51 Mil/uL (ref 4.22–5.81)
RDW: 14.3 % (ref 11.5–15.5)
WBC: 7.5 10*3/uL (ref 4.0–10.5)

## 2020-03-27 LAB — URINALYSIS, ROUTINE W REFLEX MICROSCOPIC
Bilirubin Urine: NEGATIVE
Hgb urine dipstick: NEGATIVE
Leukocytes,Ua: NEGATIVE
Nitrite: NEGATIVE
RBC / HPF: NONE SEEN (ref 0–?)
Specific Gravity, Urine: 1.01 (ref 1.000–1.030)
Total Protein, Urine: NEGATIVE
Urine Glucose: NEGATIVE
Urobilinogen, UA: 0.2 (ref 0.0–1.0)
pH: 6.5 (ref 5.0–8.0)

## 2020-03-27 LAB — BASIC METABOLIC PANEL
BUN: 14 mg/dL (ref 6–23)
CO2: 29 mEq/L (ref 19–32)
Calcium: 9.8 mg/dL (ref 8.4–10.5)
Chloride: 103 mEq/L (ref 96–112)
Creatinine, Ser: 0.95 mg/dL (ref 0.40–1.50)
GFR: 81.92 mL/min (ref >60.00)
Glucose, Bld: 134 mg/dL — ABNORMAL HIGH (ref 70–99)
Potassium: 4.5 mEq/L (ref 3.5–5.1)
Sodium: 141 mEq/L (ref 135–145)

## 2020-03-27 LAB — PSA: PSA: 0.49 ng/mL (ref 0.10–4.00)

## 2020-03-27 LAB — LIPID PANEL
Cholesterol: 168 mg/dL (ref 0–200)
HDL: 33 mg/dL — ABNORMAL LOW (ref >39.00)
LDL Cholesterol: 107 mg/dL — ABNORMAL HIGH (ref 0–99)
NonHDL: 135.43
Total CHOL/HDL Ratio: 5
Triglycerides: 142 mg/dL (ref 0.0–149.0)
VLDL: 28.4 mg/dL (ref 0.0–40.0)

## 2020-03-27 LAB — HEPATIC FUNCTION PANEL
ALT: 42 U/L (ref 0–53)
AST: 39 U/L — ABNORMAL HIGH (ref 0–37)
Albumin: 4.8 g/dL (ref 3.5–5.2)
Alkaline Phosphatase: 64 U/L (ref 39–117)
Bilirubin, Direct: 0.2 mg/dL (ref 0.0–0.3)
Total Bilirubin: 0.7 mg/dL (ref 0.2–1.2)
Total Protein: 7.5 g/dL (ref 6.0–8.3)

## 2020-03-27 LAB — VITAMIN B12: Vitamin B-12: 437 pg/mL (ref 211–911)

## 2020-03-27 LAB — VITAMIN D 25 HYDROXY (VIT D DEFICIENCY, FRACTURES): VITD: 35.36 ng/mL (ref 30.00–100.00)

## 2020-03-27 LAB — HEMOGLOBIN A1C: Hgb A1c MFr Bld: 7 % — ABNORMAL HIGH (ref 4.6–6.5)

## 2020-03-27 LAB — MICROALBUMIN / CREATININE URINE RATIO
Creatinine,U: 67.9 mg/dL
Microalb Creat Ratio: 1 mg/g (ref 0.0–30.0)
Microalb, Ur: <0.7 mg/dL (ref 0.0–1.9)

## 2020-03-27 LAB — IBC PANEL
Iron: 80 ug/dL (ref 42–165)
Saturation Ratios: 19.2 % — ABNORMAL LOW (ref 20.0–50.0)
Transferrin: 298 mg/dL (ref 212.0–360.0)

## 2020-03-27 LAB — TSH: TSH: 2.48 u[IU]/mL (ref 0.35–4.50)

## 2020-03-30 ENCOUNTER — Other Ambulatory Visit: Payer: Self-pay

## 2020-03-30 ENCOUNTER — Ambulatory Visit (INDEPENDENT_AMBULATORY_CARE_PROVIDER_SITE_OTHER): Payer: Managed Care, Other (non HMO) | Admitting: Internal Medicine

## 2020-03-30 ENCOUNTER — Encounter: Payer: Self-pay | Admitting: Internal Medicine

## 2020-03-30 VITALS — BP 140/80 | HR 72 | Temp 99.1°F | Ht 74.0 in | Wt 277.0 lb

## 2020-03-30 DIAGNOSIS — E119 Type 2 diabetes mellitus without complications: Secondary | ICD-10-CM | POA: Diagnosis not present

## 2020-03-30 DIAGNOSIS — Z0000 Encounter for general adult medical examination without abnormal findings: Secondary | ICD-10-CM | POA: Diagnosis not present

## 2020-03-30 DIAGNOSIS — E785 Hyperlipidemia, unspecified: Secondary | ICD-10-CM

## 2020-03-30 DIAGNOSIS — F339 Major depressive disorder, recurrent, unspecified: Secondary | ICD-10-CM

## 2020-03-30 DIAGNOSIS — I10 Essential (primary) hypertension: Secondary | ICD-10-CM

## 2020-03-30 DIAGNOSIS — R002 Palpitations: Secondary | ICD-10-CM

## 2020-03-30 DIAGNOSIS — F411 Generalized anxiety disorder: Secondary | ICD-10-CM

## 2020-03-30 DIAGNOSIS — Z0001 Encounter for general adult medical examination with abnormal findings: Secondary | ICD-10-CM

## 2020-03-30 MED ORDER — METFORMIN HCL ER 500 MG PO TB24: 500.0000 mg | ORAL_TABLET | Freq: Every day | ORAL | 3 refills | Status: DC

## 2020-03-30 MED ORDER — CLONAZEPAM 0.5 MG PO TABS
0.5000 mg | ORAL_TABLET | Freq: Two times a day (BID) | ORAL | 1 refills | Status: DC | PRN
Start: 2020-03-30 — End: 2021-06-04

## 2020-03-30 MED ORDER — SERTRALINE HCL 100 MG PO TABS
100.0000 mg | ORAL_TABLET | Freq: Every day | ORAL | 3 refills | Status: DC
Start: 2020-03-30 — End: 2021-06-04

## 2020-03-30 NOTE — Assessment & Plan Note (Signed)
Ok for 3 wk cardiac event monitor  I spent 42 minutes in addition to time for CPX wellness examination in preparing to see the patient by review of recent labs, imaging and procedures, obtaining and reviewing separately obtained history, communicating with the patient and family or caregiver, ordering medications, tests or procedures, and documenting clinical information in the EHR including the differential Dx, treatment, and any further evaluation and other management of palpiations,  DM, HTN HLD depression anxiety

## 2020-03-30 NOTE — Assessment & Plan Note (Signed)
With mild worseniing, ok for zoloft 100 qd, declines counseling

## 2020-03-30 NOTE — Assessment & Plan Note (Signed)
stable overall by history and exam, recent data reviewed with pt, and pt to continue medical treatment as before,  to f/u any worsening symptoms or concerns  

## 2020-03-30 NOTE — Assessment & Plan Note (Signed)

## 2020-03-30 NOTE — Assessment & Plan Note (Signed)
Ok for change lorazepam (sedation) to klonopin bid prn

## 2020-03-30 NOTE — Patient Instructions (Addendum)
Please take all new medication as prescribed - the zoloft, metformin ER 500 mg per day, and klonopin as needed  Ok to stop the lorazepam as you have  Please continue all other medications as before, and refills have been done if requested.  Please have the pharmacy call with any other refills you may need.  Please continue your efforts at being more active, low cholesterol diet, and weight control.  You are otherwise up to date with prevention measures today.  Please keep your appointments with your specialists as you may have planned  You will be contacted regarding the referral for: Cardiac Event monitor  Please make an Appointment to return in 6 months, or sooner if needed, also with Lab Appointment for testing done 3-5 days before at the [facility] (so this is for TWO appointments - please see the scheduling desk as you leave)

## 2020-03-30 NOTE — Progress Notes (Signed)
Subjective:    Patient ID: William [name], male    DOB: Jan 01, 1966, 54 y.o.   MRN: [id number]  HPI  Here for wellness and f/u;  Overall doing ok;  Pt denies Chest pain, worsening SOB, DOE, wheezing, orthopnea, PND, worsening LE edema, dizziness or syncope except for palpitaitons intermittent ongoing for several years, asking for monitor evaluation .  Pt denies neurological change such as new headache, facial or extremity weakness.  Pt denies polydipsia, polyuria, or low sugar symptoms. Pt states overall good compliance with treatment and medications, good tolerability, and has been trying to follow appropriate diet. . No fever, night sweats, wt loss, loss of appetite, or other constitutional symptoms.  Pt states good ability with ADL's, has low fall risk, home safety reviewed and adequate, no other significant changes in hearing or vision, and only occasionally active with exercise.  Has had worsening depression, started celexa in 2020 but felt he could not tolerate and did not work well., and weaned off himself.  Daughter takes the zoloft and doing well after taking this for one month.  Wt up with pandemic less good diet  Peak wt has been 305 in the past. Wt Readings from Last 3 Encounters:  03/30/20 277 lb (125.6 kg)  07/31/18 286 lb (129.7 kg)  01/30/18 275 lb (124.7 kg)   Past Medical History:  Diagnosis Date  . Anxiety   . Degenerative arthritis of lumbar spine 12/12/2013  . Diverticulitis   . Encounter for well adult exam with abnormal findings 07/12/2013  . GERD (gastroesophageal reflux disease)   . Hypertension   . Sleep apnea    Past Surgical History:  Procedure Laterality Date  . COLONOSCOPY    . WISDOM TOOTH EXTRACTION      reports that he has never smoked. He has never used smokeless tobacco. He reports current alcohol use. He reports that he does not use drugs. family history includes Cancer in his mother; Hyperlipidemia in his mother. Allergies  Allergen Reactions  . Ivp Dye  [Iodinated Diagnostic Agents] Hives    Per pt after CT with IV contrast aug 2014  . Niacin    Current Outpatient Medications on File Prior to Visit  Medication Sig Dispense Refill  . Cholecalciferol (VITAMIN D PO) Take by mouth.    . Magnesium Hydroxide (MAGNESIA PO) Take by mouth.    . sildenafil (VIAGRA) 100 MG tablet Take 0.5-1 tablets (50-100 mg total) by mouth daily as needed for erectile dysfunction. 5 tablet 11   No current facility-administered medications on file prior to visit.   Review of Systems All otherwise neg per pt     Objective:   Physical Exam BP 140/80 (BP Location: Left Arm, Patient Position: Sitting, Cuff Size: Large)   Pulse 72   Temp 99.1 F (37.3 C) (Oral)   Ht 6\' 2"  (1.88 m)   Wt 277 lb (125.6 kg)   SpO2 99%   BMI 35.56 kg/m  VS noted,  Constitutional: Pt appears in NAD HENT: Head: NCAT.  Right Ear: External ear normal.  Left Ear: External ear normal.  Eyes: . Pupils are equal, round, and reactive to light. Conjunctivae and EOM are normal Nose: without d/c or deformity Neck: Neck supple. Gross normal ROM Cardiovascular: Normal rate and regular rhythm.   Pulmonary/Chest: Effort normal and breath sounds without rales or wheezing.  Abd:  Soft, NT, ND, + BS, no organomegaly Neurological: Pt is alert. At baseline orientation, motor grossly intact Skin: Skin is warm. No  rashes, other new lesions, no LE edema Psychiatric: Pt behavior is normal without agitation , + anxious depressed All otherwise neg per pt Lab Results  Component Value Date   WBC 7.5 03/27/2020   HGB 16.6 03/27/2020   HCT 48.1 03/27/2020   PLT 169.0 03/27/2020   GLUCOSE 134 (H) 03/27/2020   CHOL 168 03/27/2020   TRIG 142.0 03/27/2020   HDL 33.00 (L) 03/27/2020   LDLDIRECT 80.0 03/28/2019   LDLCALC 107 (H) 03/27/2020   ALT 42 03/27/2020   AST 39 (H) 03/27/2020   NA 141 03/27/2020   K 4.5 03/27/2020   CL 103 03/27/2020   CREATININE 0.95 03/27/2020   BUN 14 03/27/2020   CO2  29 03/27/2020   TSH 2.48 03/27/2020   PSA 0.49 03/27/2020   HGBA1C 7.0 (H) 03/27/2020   MICROALBUR <0.7 03/27/2020      Assessment & Plan:

## 2020-03-31 ENCOUNTER — Telehealth: Payer: Self-pay | Admitting: *Deleted

## 2020-03-31 NOTE — Telephone Encounter (Signed)
Patient enrolled for [employer] to ship a 21 day cardiac event monitor to his home.  Instructions will be included in the monitor kit.

## 2020-04-02 ENCOUNTER — Ambulatory Visit (INDEPENDENT_AMBULATORY_CARE_PROVIDER_SITE_OTHER): Payer: Managed Care, Other (non HMO)

## 2020-04-02 DIAGNOSIS — R002 Palpitations: Secondary | ICD-10-CM | POA: Diagnosis not present

## 2020-05-01 ENCOUNTER — Other Ambulatory Visit: Payer: Self-pay | Admitting: Internal Medicine

## 2020-05-01 ENCOUNTER — Encounter: Payer: Self-pay | Admitting: Internal Medicine

## 2020-05-01 DIAGNOSIS — I472 Ventricular tachycardia, unspecified: Secondary | ICD-10-CM

## 2020-08-11 DIAGNOSIS — I493 Ventricular premature depolarization: Secondary | ICD-10-CM | POA: Insufficient documentation

## 2020-09-28 ENCOUNTER — Ambulatory Visit: Payer: Managed Care, Other (non HMO) | Admitting: Internal Medicine

## 2020-10-16 ENCOUNTER — Ambulatory Visit: Payer: Managed Care, Other (non HMO) | Admitting: Internal Medicine

## 2021-03-15 ENCOUNTER — Other Ambulatory Visit: Payer: Self-pay | Admitting: Internal Medicine

## 2021-03-15 NOTE — Telephone Encounter (Signed)
Please refill as per office routine med refill policy (all routine meds refilled for 3 mo or monthly per pt preference up to one year from last visit, then month to month grace period for 3 mo, then further med refills will have to be denied)  

## 2021-03-15 NOTE — Telephone Encounter (Signed)
Left patient a voicemail to call the office back to schedule a visit with doctor Jenny Reichmann as he is overdue since October.

## 2021-04-09 ENCOUNTER — Other Ambulatory Visit: Payer: Self-pay | Admitting: Internal Medicine

## 2021-04-09 NOTE — Telephone Encounter (Signed)
Please refill as per office routine med refill policy (all routine meds refilled for 3 mo or monthly per pt preference up to one year from last visit, then month to month grace period for 3 mo, then further med refills will have to be denied)  

## 2021-05-08 ENCOUNTER — Other Ambulatory Visit: Payer: Self-pay | Admitting: Internal Medicine

## 2021-05-08 NOTE — Telephone Encounter (Signed)
Please refill as per office routine med refill policy (all routine meds refilled for 3 mo or monthly per pt preference up to one year from last visit, then month to month grace period for 3 mo, then further med refills will have to be denied)  

## 2021-05-10 NOTE — Telephone Encounter (Signed)
Left patient a voicemail to call office back to schedule yearly appt.

## 2021-05-11 ENCOUNTER — Other Ambulatory Visit: Payer: Self-pay

## 2021-05-11 MED ORDER — METFORMIN HCL ER 500 MG PO TB24: ORAL_TABLET | ORAL | 0 refills | Status: DC

## 2021-05-11 NOTE — Telephone Encounter (Signed)
Left pt a voicemail to call office to schedule an annual visit... medication request denied

## 2021-06-04 ENCOUNTER — Other Ambulatory Visit: Payer: Self-pay

## 2021-06-04 ENCOUNTER — Encounter: Payer: Self-pay | Admitting: Internal Medicine

## 2021-06-04 ENCOUNTER — Ambulatory Visit (INDEPENDENT_AMBULATORY_CARE_PROVIDER_SITE_OTHER): Payer: Managed Care, Other (non HMO) | Admitting: Internal Medicine

## 2021-06-04 VITALS — BP 148/88 | HR 80 | Temp 98.4°F | Ht 74.0 in | Wt 275.4 lb

## 2021-06-04 DIAGNOSIS — I10 Essential (primary) hypertension: Secondary | ICD-10-CM | POA: Diagnosis not present

## 2021-06-04 DIAGNOSIS — F339 Major depressive disorder, recurrent, unspecified: Secondary | ICD-10-CM

## 2021-06-04 DIAGNOSIS — E538 Deficiency of other specified B group vitamins: Secondary | ICD-10-CM

## 2021-06-04 DIAGNOSIS — E7800 Pure hypercholesterolemia, unspecified: Secondary | ICD-10-CM

## 2021-06-04 DIAGNOSIS — M5431 Sciatica, right side: Secondary | ICD-10-CM | POA: Diagnosis not present

## 2021-06-04 DIAGNOSIS — E559 Vitamin D deficiency, unspecified: Secondary | ICD-10-CM | POA: Diagnosis not present

## 2021-06-04 DIAGNOSIS — E1165 Type 2 diabetes mellitus with hyperglycemia: Secondary | ICD-10-CM | POA: Diagnosis not present

## 2021-06-04 DIAGNOSIS — Z0001 Encounter for general adult medical examination with abnormal findings: Secondary | ICD-10-CM | POA: Diagnosis not present

## 2021-06-04 LAB — URINALYSIS, ROUTINE W REFLEX MICROSCOPIC
Bilirubin Urine: NEGATIVE
Hgb urine dipstick: NEGATIVE
Ketones, ur: NEGATIVE
Leukocytes,Ua: NEGATIVE
Nitrite: NEGATIVE
RBC / HPF: NONE SEEN (ref 0–?)
Specific Gravity, Urine: 1.015 (ref 1.000–1.030)
Total Protein, Urine: NEGATIVE
Urine Glucose: >=1000 — AB
Urobilinogen, UA: 0.2 (ref 0.0–1.0)
pH: 6 (ref 5.0–8.0)

## 2021-06-04 LAB — HEPATIC FUNCTION PANEL
ALT: 55 U/L — ABNORMAL HIGH (ref 0–53)
AST: 48 U/L — ABNORMAL HIGH (ref 0–37)
Albumin: 4.8 g/dL (ref 3.5–5.2)
Alkaline Phosphatase: 75 U/L (ref 39–117)
Bilirubin, Direct: 0.2 mg/dL (ref 0.0–0.3)
Total Bilirubin: 0.8 mg/dL (ref 0.2–1.2)
Total Protein: 8.2 g/dL (ref 6.0–8.3)

## 2021-06-04 LAB — TSH: TSH: 5.04 u[IU]/mL (ref 0.35–5.50)

## 2021-06-04 LAB — CBC WITH DIFFERENTIAL/PLATELET
Basophils Absolute: 0.1 10*3/uL (ref 0.0–0.1)
Basophils Relative: 1.7 % (ref 0.0–3.0)
Eosinophils Absolute: 0.1 10*3/uL (ref 0.0–0.7)
Eosinophils Relative: 1.8 % (ref 0.0–5.0)
HCT: 46.8 % (ref 39.0–52.0)
Hemoglobin: 16.6 g/dL (ref 13.0–17.0)
Lymphocytes Relative: 35 % (ref 12.0–46.0)
Lymphs Abs: 2.7 10*3/uL (ref 0.7–4.0)
MCHC: 35.3 g/dL (ref 30.0–36.0)
MCV: 83.7 fl (ref 78.0–100.0)
Monocytes Absolute: 0.5 10*3/uL (ref 0.1–1.0)
Monocytes Relative: 7.1 % (ref 3.0–12.0)
Neutro Abs: 4.2 10*3/uL (ref 1.4–7.7)
Neutrophils Relative %: 54.4 % (ref 43.0–77.0)
Platelets: 199 10*3/uL (ref 150.0–400.0)
RBC: 5.59 Mil/uL (ref 4.22–5.81)
RDW: 13.7 % (ref 11.5–15.5)
WBC: 7.6 10*3/uL (ref 4.0–10.5)

## 2021-06-04 LAB — MICROALBUMIN / CREATININE URINE RATIO
Creatinine,U: 98.5 mg/dL
Microalb Creat Ratio: 1.6 mg/g (ref 0.0–30.0)
Microalb, Ur: 1.5 mg/dL (ref 0.0–1.9)

## 2021-06-04 LAB — LIPID PANEL
Cholesterol: 219 mg/dL — ABNORMAL HIGH (ref 0–200)
HDL: 32.5 mg/dL — ABNORMAL LOW (ref >39.00)
Total CHOL/HDL Ratio: 7
Triglycerides: 596 mg/dL — ABNORMAL HIGH (ref 0.0–149.0)

## 2021-06-04 LAB — BASIC METABOLIC PANEL
BUN: 10 mg/dL (ref 6–23)
CO2: 26 mEq/L (ref 19–32)
Calcium: 9.9 mg/dL (ref 8.4–10.5)
Chloride: 96 mEq/L (ref 96–112)
Creatinine, Ser: 0.99 mg/dL (ref 0.40–1.50)
GFR: 84.57 mL/min (ref >60.00)
Glucose, Bld: 293 mg/dL — ABNORMAL HIGH (ref 70–99)
Potassium: 4.1 mEq/L (ref 3.5–5.1)
Sodium: 132 mEq/L — ABNORMAL LOW (ref 135–145)

## 2021-06-04 LAB — HEMOGLOBIN A1C: Hgb A1c MFr Bld: 10.3 % — ABNORMAL HIGH (ref 4.6–6.5)

## 2021-06-04 LAB — VITAMIN D 25 HYDROXY (VIT D DEFICIENCY, FRACTURES): VITD: 43.17 ng/mL (ref 30.00–100.00)

## 2021-06-04 LAB — PSA: PSA: 0.4 ng/mL (ref 0.10–4.00)

## 2021-06-04 LAB — LDL CHOLESTEROL, DIRECT: Direct LDL: 60 mg/dL

## 2021-06-04 LAB — VITAMIN B12: Vitamin B-12: 417 pg/mL (ref 211–911)

## 2021-06-04 MED ORDER — METFORMIN HCL ER 500 MG PO TB24: ORAL_TABLET | ORAL | 3 refills | Status: DC

## 2021-06-04 MED ORDER — METFORMIN HCL ER 500 MG PO TB24: ORAL_TABLET | ORAL | 0 refills | Status: DC

## 2021-06-04 MED ORDER — SILDENAFIL CITRATE 100 MG PO TABS: 50.0000 mg | ORAL_TABLET | Freq: Every day | ORAL | 11 refills | Status: DC | PRN

## 2021-06-04 MED ORDER — CLONAZEPAM 0.5 MG PO TABS: 0.5000 mg | ORAL_TABLET | Freq: Two times a day (BID) | ORAL | 1 refills | Status: DC | PRN

## 2021-06-04 NOTE — Patient Instructions (Signed)

## 2021-06-04 NOTE — Progress Notes (Signed)
Patient ID: William [name], male   DOB: 1966/10/17, 55 y.o.   MRN: [id number]         Chief Complaint:: wellness exam and htn, hld, right sciatica chronic recurrent, dm       HPI:  William [name] is a 55 y.o. male here for wellness exam, plans to call for eye exam soon; decliens covid vax, shingrix, pneumovax, tdap, o/w up to date with preventive referrals and immunizations  has a new position at work with much less driving the 32K miles per yr he used to.                        Also has covid infection early may 2022.  Did not have vax/does not want.  Has been out of toprol for 3 wks.  Trying to follow lower chol diet - does not want statin.  Pt continues to have recurring right LBP with sciatica pain, but no bowel or bladder change, fever, wt loss,  gait change or falls.  Pt denies chest pain, increased sob or doe, wheezing, orthopnea, PND, increased LE swelling, palpitations, dizziness or syncope.   Pt denies polydipsia, polyuria, or new focal neuro s/s.   Pt denies fever, wt loss, night sweats, loss of appetite, or other constitutional symptoms   No new complaints   Wt Readings from Last 3 Encounters:  06/04/21 275 lb 6.4 oz (124.9 kg)  03/30/20 277 lb (125.6 kg)  07/31/18 286 lb (129.7 kg)   BP Readings from Last 3 Encounters:  06/04/21 (!) 148/88  03/30/20 140/80  07/31/18 138/86   Immunization History  Administered Date(s) Administered   Td 12/06/2003   There are no preventive care reminders to display for this patient.     Past Medical History:  Diagnosis Date   Anxiety    Degenerative arthritis of lumbar spine 12/12/2013   Diverticulitis    Encounter for well adult exam with abnormal findings 07/12/2013   GERD (gastroesophageal reflux disease)    Hypertension    Sleep apnea    Past Surgical History:  Procedure Laterality Date   COLONOSCOPY     WISDOM TOOTH EXTRACTION      reports that he has never smoked. He has never used smokeless tobacco. He reports current alcohol  use. He reports that he does not use drugs. family history includes Cancer in his mother; Hyperlipidemia in his mother. Allergies  Allergen Reactions   Acetaminophen     Other reaction(s): Other (See Comments) "made me feel misplaced"   Blue Dyes (Parenteral)     Other reaction(s): Other (See Comments) Pt states he is "just allergic to dyes" Unknown as to what dye that is.  "made my skin break out in hives"   Ivp Dye [Iodinated Diagnostic Agents] Hives    Per pt after CT with IV contrast aug 2014   Niacin    Current Outpatient Medications on File Prior to Visit  Medication Sig Dispense Refill   Cholecalciferol (VITAMIN D PO) Take by mouth.     Magnesium Hydroxide (MAGNESIA PO) Take by mouth.     metoprolol succinate (TOPROL-XL) 25 MG 24 hr tablet Take 25 mg by mouth daily.     No current facility-administered medications on file prior to visit.        ROS:  All others reviewed and negative.  Objective        PE:  BP (!) 148/88 (BP Location: Right Arm, Patient Position: Sitting, Cuff Size:  Large)   Pulse 80   Temp 98.4 F (36.9 C) (Oral)   Ht 6\' 2"  (1.88 m)   Wt 275 lb 6.4 oz (124.9 kg)   SpO2 96%   BMI 35.36 kg/m                 Constitutional: Pt appears in NAD               HENT: Head: NCAT.                Right Ear: External ear normal.                 Left Ear: External ear normal.                Eyes: . Pupils are equal, round, and reactive to light. Conjunctivae and EOM are normal               Nose: without d/c or deformity               Neck: Neck supple. Gross normal ROM               Cardiovascular: Normal rate and regular rhythm.                 Pulmonary/Chest: Effort normal and breath sounds without rales or wheezing.                Abd:  Soft, NT, ND, + BS, no organomegaly               Neurological: Pt is alert. At baseline orientation, motor grossly intact               Skin: Skin is warm. No rashes, no other new lesions, LE edema - none                Psychiatric: Pt behavior is normal without agitation   Micro: none  Cardiac tracings I have personally interpreted today:  none  Pertinent Radiological findings (summarize): none   Lab Results  Component Value Date   WBC 7.6 06/04/2021   HGB 16.6 06/04/2021   HCT 46.8 06/04/2021   PLT 199.0 06/04/2021   GLUCOSE 293 (H) 06/04/2021   CHOL 219 (H) 06/04/2021   TRIG (H) 06/04/2021    596.0 Triglyceride is over 400; calculations on Lipids are invalid.   HDL 32.50 (L) 06/04/2021   LDLDIRECT 60.0 06/04/2021   LDLCALC 107 (H) 03/27/2020   ALT 55 (H) 06/04/2021   AST 48 (H) 06/04/2021   NA 132 (L) 06/04/2021   K 4.1 06/04/2021   CL 96 06/04/2021   CREATININE 0.99 06/04/2021   BUN 10 06/04/2021   CO2 26 06/04/2021   TSH 5.04 06/04/2021   PSA 0.40 06/04/2021   HGBA1C 10.3 (H) 06/04/2021   MICROALBUR 1.5 06/04/2021   Assessment/Plan:  William [name] is a 55 y.o. White or Caucasian [1] male with  has a past medical history of Anxiety, Degenerative arthritis of lumbar spine (12/12/2013), Diverticulitis, Encounter for well adult exam with abnormal findings (07/12/2013), GERD (gastroesophageal reflux disease), Hypertension, and Sleep apnea.  Encounter for well adult exam with abnormal findings Age and sex appropriate education and counseling updated with regular exercise and diet Referrals for preventative services - plans to call himself for eye exam Immunizations addressed - decliens covid vax, shingirx, pneumovax, tdap Smoking counseling  - none needed Evidence for depression or other mood disorder - none significant Most recent labs  reviewed. I have personally reviewed and have noted: 1) the patient's medical and social history 2) The patient's current medications and supplements 3) The patient's height, weight, and BMI have been recorded in the chart   Diabetes ([place])  Stable by hx, pt to continue current medical treatment metofrmiin, check a1c   Essential hypertension BP  Readings from Last 3 Encounters:  06/04/21 (!) 148/88  03/30/20 140/80  07/31/18 138/86   uncontrolled, pt to restart medical treatment toprol   Hyperlipidemia Lab Results  Component Value Date   LDLCALC 107 (H) 03/27/2020   Stable, pt to continue current low chol diet, decliens statin   Depression, recurrent (HCC) Stable per pt,  to f/u any worsening symptoms or concerns, declines need for change in tx   Right sided sciatica Recently chronic mild, no neuro change, declines tx or MRI  Followup: Return in about 1 year (around 06/04/2022).  Cathlean Cower, MD 06/05/2021 4:08 PM [facility]

## 2021-06-05 ENCOUNTER — Encounter: Payer: Self-pay | Admitting: Internal Medicine

## 2021-06-05 DIAGNOSIS — M5431 Sciatica, right side: Secondary | ICD-10-CM | POA: Insufficient documentation

## 2021-06-05 NOTE — Assessment & Plan Note (Signed)
Stable per pt,  to f/u any worsening symptoms or concerns, declines need for change in tx

## 2021-06-05 NOTE — Assessment & Plan Note (Signed)
Lab Results  Component Value Date   LDLCALC 107 (H) 03/27/2020   Stable, pt to continue current low chol diet, decliens statin

## 2021-06-05 NOTE — Assessment & Plan Note (Signed)
  Stable by hx, pt to continue current medical treatment metofrmiin, check a1c

## 2021-06-05 NOTE — Assessment & Plan Note (Signed)
BP Readings from Last 3 Encounters:  06/04/21 (!) 148/88  03/30/20 140/80  07/31/18 138/86   uncontrolled, pt to restart medical treatment toprol

## 2021-06-05 NOTE — Assessment & Plan Note (Signed)
Age and sex appropriate education and counseling updated with regular exercise and diet Referrals for preventative services - plans to call himself for eye exam Immunizations addressed - decliens covid vax, shingirx, pneumovax, tdap Smoking counseling  - none needed Evidence for depression or other mood disorder - none significant Most recent labs reviewed. I have personally reviewed and have noted: 1) the patient's medical and social history 2) The patient's current medications and supplements 3) The patient's height, weight, and BMI have been recorded in the chart

## 2021-06-05 NOTE — Assessment & Plan Note (Signed)
Recently chronic mild, no neuro change, declines tx or MRI

## 2021-06-08 ENCOUNTER — Encounter: Payer: Self-pay | Admitting: Internal Medicine

## 2021-06-08 ENCOUNTER — Other Ambulatory Visit: Payer: Self-pay | Admitting: Internal Medicine

## 2021-06-08 MED ORDER — METFORMIN HCL ER 500 MG PO TB24: ORAL_TABLET | ORAL | 3 refills | Status: DC

## 2021-06-20 ENCOUNTER — Other Ambulatory Visit: Payer: Self-pay | Admitting: Internal Medicine

## 2021-06-20 NOTE — Telephone Encounter (Signed)
Please refill as per office routine med refill policy (all routine meds refilled for 3 mo or monthly per pt preference up to one year from last visit, then month to month grace period for 3 mo, then further med refills will have to be denied)  

## 2022-05-18 LAB — HM DIABETES EYE EXAM

## 2022-06-03 ENCOUNTER — Other Ambulatory Visit (INDEPENDENT_AMBULATORY_CARE_PROVIDER_SITE_OTHER): Payer: Managed Care, Other (non HMO)

## 2022-06-03 DIAGNOSIS — E119 Type 2 diabetes mellitus without complications: Secondary | ICD-10-CM

## 2022-06-03 LAB — LDL CHOLESTEROL, DIRECT: Direct LDL: 49 mg/dL

## 2022-06-03 LAB — BASIC METABOLIC PANEL
BUN: 14 mg/dL (ref 6–23)
CO2: 27 mEq/L (ref 19–32)
Calcium: 9.5 mg/dL (ref 8.4–10.5)
Chloride: 100 mEq/L (ref 96–112)
Creatinine, Ser: 0.97 mg/dL (ref 0.40–1.50)
GFR: 86.07 mL/min (ref >60.00)
Glucose, Bld: 230 mg/dL — ABNORMAL HIGH (ref 70–99)
Potassium: 4.1 mEq/L (ref 3.5–5.1)
Sodium: 134 mEq/L — ABNORMAL LOW (ref 135–145)

## 2022-06-03 LAB — HEPATIC FUNCTION PANEL
ALT: 42 U/L (ref 0–53)
AST: 37 U/L (ref 0–37)
Albumin: 4.6 g/dL (ref 3.5–5.2)
Alkaline Phosphatase: 62 U/L (ref 39–117)
Bilirubin, Direct: 0.2 mg/dL (ref 0.0–0.3)
Total Bilirubin: 0.8 mg/dL (ref 0.2–1.2)
Total Protein: 7.5 g/dL (ref 6.0–8.3)

## 2022-06-03 LAB — LIPID PANEL
Cholesterol: 168 mg/dL (ref 0–200)
HDL: 29.5 mg/dL — ABNORMAL LOW (ref >39.00)
Total CHOL/HDL Ratio: 6
Triglycerides: 480 mg/dL — ABNORMAL HIGH (ref 0.0–149.0)

## 2022-06-03 LAB — HEMOGLOBIN A1C: Hgb A1c MFr Bld: 10.2 % — ABNORMAL HIGH (ref 4.6–6.5)

## 2022-06-06 ENCOUNTER — Telehealth: Payer: Self-pay | Admitting: *Deleted

## 2022-06-06 ENCOUNTER — Ambulatory Visit (INDEPENDENT_AMBULATORY_CARE_PROVIDER_SITE_OTHER): Payer: Managed Care, Other (non HMO) | Admitting: Internal Medicine

## 2022-06-06 ENCOUNTER — Encounter: Payer: Self-pay | Admitting: Internal Medicine

## 2022-06-06 VITALS — BP 140/78 | HR 74 | Temp 98.3°F | Ht 74.0 in | Wt 270.0 lb

## 2022-06-06 DIAGNOSIS — E7800 Pure hypercholesterolemia, unspecified: Secondary | ICD-10-CM

## 2022-06-06 DIAGNOSIS — Z0001 Encounter for general adult medical examination with abnormal findings: Secondary | ICD-10-CM

## 2022-06-06 DIAGNOSIS — F339 Major depressive disorder, recurrent, unspecified: Secondary | ICD-10-CM | POA: Diagnosis not present

## 2022-06-06 DIAGNOSIS — Z125 Encounter for screening for malignant neoplasm of prostate: Secondary | ICD-10-CM | POA: Diagnosis not present

## 2022-06-06 DIAGNOSIS — E559 Vitamin D deficiency, unspecified: Secondary | ICD-10-CM | POA: Diagnosis not present

## 2022-06-06 DIAGNOSIS — E1165 Type 2 diabetes mellitus with hyperglycemia: Secondary | ICD-10-CM

## 2022-06-06 DIAGNOSIS — I10 Essential (primary) hypertension: Secondary | ICD-10-CM

## 2022-06-06 DIAGNOSIS — E538 Deficiency of other specified B group vitamins: Secondary | ICD-10-CM

## 2022-06-06 LAB — CBC WITH DIFFERENTIAL/PLATELET
Basophils Absolute: 0.1 10*3/uL (ref 0.0–0.1)
Basophils Relative: 1 % (ref 0.0–3.0)
Eosinophils Absolute: 0.3 10*3/uL (ref 0.0–0.7)
Eosinophils Relative: 3.5 % (ref 0.0–5.0)
HCT: 46.3 % (ref 39.0–52.0)
Hemoglobin: 16.1 g/dL (ref 13.0–17.0)
Lymphocytes Relative: 39.1 % (ref 12.0–46.0)
Lymphs Abs: 3.2 10*3/uL (ref 0.7–4.0)
MCHC: 34.7 g/dL (ref 30.0–36.0)
MCV: 84.9 fl (ref 78.0–100.0)
Monocytes Absolute: 0.6 10*3/uL (ref 0.1–1.0)
Monocytes Relative: 6.9 % (ref 3.0–12.0)
Neutro Abs: 4.1 10*3/uL (ref 1.4–7.7)
Neutrophils Relative %: 49.5 % (ref 43.0–77.0)
Platelets: 179 10*3/uL (ref 150.0–400.0)
RBC: 5.46 Mil/uL (ref 4.22–5.81)
RDW: 13.7 % (ref 11.5–15.5)
WBC: 8.3 10*3/uL (ref 4.0–10.5)

## 2022-06-06 LAB — URINALYSIS, ROUTINE W REFLEX MICROSCOPIC
Bilirubin Urine: NEGATIVE
Hgb urine dipstick: NEGATIVE
Ketones, ur: NEGATIVE
Leukocytes,Ua: NEGATIVE
Nitrite: NEGATIVE
RBC / HPF: NONE SEEN (ref 0–?)
Specific Gravity, Urine: <=1.005 — AB (ref 1.000–1.030)
Total Protein, Urine: NEGATIVE
Urine Glucose: 100 — AB
Urobilinogen, UA: 0.2 (ref 0.0–1.0)
WBC, UA: NONE SEEN (ref 0–?)
pH: 6 (ref 5.0–8.0)

## 2022-06-06 LAB — VITAMIN D 25 HYDROXY (VIT D DEFICIENCY, FRACTURES): VITD: 24.07 ng/mL — ABNORMAL LOW (ref 30.00–100.00)

## 2022-06-06 LAB — MICROALBUMIN / CREATININE URINE RATIO
Creatinine,U: 43.5 mg/dL
Microalb Creat Ratio: 1.6 mg/g (ref 0.0–30.0)
Microalb, Ur: <0.7 mg/dL (ref 0.0–1.9)

## 2022-06-06 LAB — VITAMIN B12: Vitamin B-12: >1504 pg/mL — ABNORMAL HIGH (ref 211–911)

## 2022-06-06 LAB — TSH: TSH: 4.52 u[IU]/mL (ref 0.35–5.50)

## 2022-06-06 LAB — PSA: PSA: 0.26 ng/mL (ref 0.10–4.00)

## 2022-06-06 MED ORDER — DAPAGLIFLOZIN PROPANEDIOL 10 MG PO TABS: 10.0000 mg | ORAL_TABLET | Freq: Every day | ORAL | 3 refills | Status: DC

## 2022-06-06 MED ORDER — CITALOPRAM HYDROBROMIDE 10 MG PO TABS: 10.0000 mg | ORAL_TABLET | Freq: Every day | ORAL | 3 refills | Status: DC

## 2022-06-06 MED ORDER — OZEMPIC (0.25 OR 0.5 MG/DOSE) 2 MG/3ML ~~LOC~~ SOPN: PEN_INJECTOR | SUBCUTANEOUS | 3 refills | Status: DC

## 2022-06-06 MED ORDER — METOPROLOL SUCCINATE ER 50 MG PO TB24: 50.0000 mg | ORAL_TABLET | Freq: Every day | ORAL | 3 refills | Status: DC

## 2022-06-06 NOTE — Progress Notes (Unsigned)
Patient ID: William [name], male   DOB: 1966-01-19, 56 y.o.   MRN: [id number]         Chief Complaint:: wellness exam and hld, htn, dm, depression       HPI:  William [name] is a 56 y.o. male here for wellness exam; declines shingrix, and tdap, o/w up to date                        Also wife with breast ca and he has been taking care of her, now back in the past 2 wks to walking 2 miles per day; she is now back to work.  Lost his better job, but now working again.  Going on cruise soon.  Wants to cont BP as is bc plans to be back in better shape.  Has also had diarrhea and GI upset with metformn 3 pills per day, also had some similar to even 1 pill. CBGs have been over 200 at times recently, has also some numbness to the toes, thinks maybe neuropathy.  Saw eye appt last mo   - no retinopathy .  Has increased sleep difficulty with up to BR with weaker stream  Wife no longer in bed from chem effect and surgury breast ca.  Klonopin too sedating, cannot take.  Has had mild worsening depressive symptoms, but no suicidal ideation, or panic; has ongoing anxiety.     Wt Readings from Last 3 Encounters:  06/06/22 270 lb (122.5 kg)  06/04/21 275 lb 6.4 oz (124.9 kg)  03/30/20 277 lb (125.6 kg)   BP Readings from Last 3 Encounters:  06/06/22 140/78  06/04/21 (!) 148/88  03/30/20 140/80   Immunization History  Administered Date(s) Administered   Td 12/06/2003   There are no preventive care reminders to display for this patient.     Past Medical History:  Diagnosis Date   Anxiety    Degenerative arthritis of lumbar spine 12/12/2013   Diverticulitis    Encounter for well adult exam with abnormal findings 07/12/2013   GERD (gastroesophageal reflux disease)    Hypertension    Sleep apnea    Past Surgical History:  Procedure Laterality Date   COLONOSCOPY     WISDOM TOOTH EXTRACTION      reports that he has never smoked. He has never used smokeless tobacco. He reports current alcohol use. He  reports that he does not use drugs. family history includes Cancer in his mother; Hyperlipidemia in his mother. Allergies  Allergen Reactions   Acetaminophen     Other reaction(s): Other (See Comments) "made me feel misplaced"   Blue Dyes (Parenteral)     Other reaction(s): Other (See Comments) Pt states he is "just allergic to dyes" Unknown as to what dye that is.  "made my skin break out in hives"   Ivp Dye [Iodinated Contrast Media] Hives    Per pt after CT with IV contrast aug 2014   Metformin And Related Diarrhea   Niacin    Current Outpatient Medications on File Prior to Visit  Medication Sig Dispense Refill   Cholecalciferol (VITAMIN D PO) Take by mouth.     Magnesium Hydroxide (MAGNESIA PO) Take by mouth.     sildenafil (VIAGRA) 100 MG tablet Take 0.5-1 tablets (50-100 mg total) by mouth daily as needed for erectile dysfunction. 5 tablet 11   No current facility-administered medications on file prior to visit.        ROS:  All  others reviewed and negative.  Objective        PE:  BP 140/78 (BP Location: Left Arm, Patient Position: Sitting, Cuff Size: Large)   Pulse 74   Temp 98.3 F (36.8 C) (Oral)   Ht '6\' 2"'$  (1.88 m)   Wt 270 lb (122.5 kg)   SpO2 99%   BMI 34.67 kg/m                 Constitutional: Pt appears in NAD               HENT: Head: NCAT.                Right Ear: External ear normal.                 Left Ear: External ear normal.                Eyes: . Pupils are equal, round, and reactive to light. Conjunctivae and EOM are normal               Nose: without d/c or deformity               Neck: Neck supple. Gross normal ROM               Cardiovascular: Normal rate and regular rhythm.                 Pulmonary/Chest: Effort normal and breath sounds without rales or wheezing.                Abd:  Soft, NT, ND, + BS, no organomegaly               Neurological: Pt is alert. At baseline orientation, motor grossly intact               Skin: Skin is warm.  No rashes, no other new lesions, LE edema - none               Psychiatric: Pt behavior is normal without agitation , depressed nervous affect  Micro: none  Cardiac tracings I have personally interpreted today:  none  Pertinent Radiological findings (summarize): none   Lab Results  Component Value Date   WBC 8.3 06/06/2022   HGB 16.1 06/06/2022   HCT 46.3 06/06/2022   PLT 179.0 06/06/2022   GLUCOSE 230 (H) 06/03/2022   CHOL 168 06/03/2022   TRIG (H) 06/03/2022    480.0 Triglyceride is over 400; calculations on Lipids are invalid.   HDL 29.50 (L) 06/03/2022   LDLDIRECT 49.0 06/03/2022   LDLCALC 107 (H) 03/27/2020   ALT 42 06/03/2022   AST 37 06/03/2022   NA 134 (L) 06/03/2022   K 4.1 06/03/2022   CL 100 06/03/2022   CREATININE 0.97 06/03/2022   BUN 14 06/03/2022   CO2 27 06/03/2022   TSH 4.52 06/06/2022   PSA 0.26 06/06/2022   HGBA1C 10.2 (H) 06/03/2022   MICROALBUR <0.7 06/06/2022   Assessment/Plan:  William [name] is a 56 y.o. White or Caucasian [1] male with  has a past medical history of Anxiety, Degenerative arthritis of lumbar spine (12/12/2013), Diverticulitis, Encounter for well adult exam with abnormal findings (07/12/2013), GERD (gastroesophageal reflux disease), Hypertension, and Sleep apnea.  Encounter for well adult exam with abnormal findings Age and sex appropriate education and counseling updated with regular exercise and diet Referrals for preventative services - none needed Immunizations addressed - declines shingrix, tdap Smoking counseling  -  none needed Evidence for depression or other mood disorder - worsening depression anxiety for celexa 10 mg qd Most recent labs reviewed. I have personally reviewed and have noted: 1) the patient's medical and social history 2) The patient's current medications and supplements 3) The patient's height, weight, and BMI have been recorded in the chart   Hyperlipidemia Direct LDL mg/dL 49.0  60.0 CM  80.0   LDL  controlled, declines low dose statin .cont low chol diet   Essential hypertension BP Readings from Last 3 Encounters:  06/06/22 140/78  06/04/21 (!) 148/88  03/30/20 140/80   Uncontrolled, for increased toprol XL 50 mg qd    Diabetes ([place]) Lab Results  Component Value Date   HGBA1C 10.2 (H) 06/03/2022   Severe uncontrolled in the setting of obesity, pt states intoleranct metformin due to GI upset he has just tolerating, pt to change to farxiga 10 qd, and also add ozempic 0.5 mg weekly for sugar and wt control    Depression, recurrent (HCC) Worsening, for add celexa 10 mg qd, delcines referral for counseling  Vitamin D deficiency Last vitamin D Lab Results  Component Value Date   VD25OH 24.07 (L) 06/06/2022   Low, to start oral replacement  Followup: Return in about 6 months (around 12/07/2022).  Cathlean Cower, MD 06/07/2022 10:55 AM [facility]

## 2022-06-06 NOTE — Telephone Encounter (Signed)
Pt was on cover-my-meds need PA for Ozempic. Submitted PA w/ (Key: [id number]). Rec'd instant msg "  This request has been approved using information available on the patient's profile.  OXBDZ:[id number]; Status: Approved; Review Type:Prior Auth;Coverage Start Date:05/07/2022;Coverage End Date:06/06/2023; Fxed approval to pof.Marland KitchenJohny Chess

## 2022-06-06 NOTE — Patient Instructions (Signed)
Please take all new medication as prescribed  - the celexa for depression/anxiety, ozempic and farxiga for sugar  Ok to stop the metformin  Ok to increase the toprol x to 50 mg per day  Please continue all other medications as before, and refills have been done if requested.  Please have the pharmacy call with any other refills you may need.  Please continue your efforts at being more active, low cholesterol diet, and weight control.  You are otherwise up to date with prevention measures today.  Please keep your appointments with your specialists as you may have planned  Please make an Appointment to return in 6 months, or sooner if needed, also with Lab Appointment for testing done 3-5 days before at the [facility] (so this is for TWO appointments - please see the scheduling desk as you leave)  Due to the ongoing Covid 19 pandemic, our lab now requires an appointment for any labs done at our office.  If you need labs done and do not have an appointment, please call our office ahead of time to schedule before presenting to the lab for your testing.

## 2022-06-07 ENCOUNTER — Encounter: Payer: Self-pay | Admitting: Internal Medicine

## 2022-06-07 DIAGNOSIS — E559 Vitamin D deficiency, unspecified: Secondary | ICD-10-CM | POA: Insufficient documentation

## 2022-06-07 NOTE — Assessment & Plan Note (Signed)
BP Readings from Last 3 Encounters:  06/06/22 140/78  06/04/21 (!) 148/88  03/30/20 140/80   Uncontrolled, for increased toprol XL 50 mg qd

## 2022-06-07 NOTE — Assessment & Plan Note (Signed)
Worsening, for add celexa 10 mg qd, delcines referral for counseling

## 2022-06-07 NOTE — Assessment & Plan Note (Signed)
Lab Results  Component Value Date   HGBA1C 10.2 (H) 06/03/2022   Severe uncontrolled in the setting of obesity, pt states intoleranct metformin due to GI upset he has just tolerating, pt to change to farxiga 10 qd, and also add ozempic 0.5 mg weekly for sugar and wt control

## 2022-06-07 NOTE — Assessment & Plan Note (Signed)
Direct LDL mg/dL 49.0  60.0 CM  80.0   LDL controlled, declines low dose statin .cont low chol diet

## 2022-06-07 NOTE — Assessment & Plan Note (Signed)
Age and sex appropriate education and counseling updated with regular exercise and diet Referrals for preventative services - none needed Immunizations addressed - declines shingrix, tdap Smoking counseling  - none needed Evidence for depression or other mood disorder - worsening depression anxiety for celexa 10 mg qd Most recent labs reviewed. I have personally reviewed and have noted: 1) the patient's medical and social history 2) The patient's current medications and supplements 3) The patient's height, weight, and BMI have been recorded in the chart

## 2022-06-07 NOTE — Assessment & Plan Note (Signed)
Last vitamin D Lab Results  Component Value Date   VD25OH 24.07 (L) 06/06/2022   Low, to start oral replacement

## 2022-06-24 ENCOUNTER — Other Ambulatory Visit: Payer: Self-pay | Admitting: Internal Medicine

## 2022-06-28 ENCOUNTER — Ambulatory Visit: Payer: Managed Care, Other (non HMO) | Admitting: Internal Medicine

## 2022-06-28 ENCOUNTER — Encounter: Payer: Self-pay | Admitting: Internal Medicine

## 2022-06-28 DIAGNOSIS — K5792 Diverticulitis of intestine, part unspecified, without perforation or abscess without bleeding: Secondary | ICD-10-CM | POA: Diagnosis not present

## 2022-06-28 MED ORDER — AMOXICILLIN-POT CLAVULANATE 875-125 MG PO TABS: 1.0000 | ORAL_TABLET | Freq: Two times a day (BID) | ORAL | 0 refills | Status: AC

## 2022-06-28 MED ORDER — AMOXICILLIN-POT CLAVULANATE 875-125 MG PO TABS: 1.0000 | ORAL_TABLET | Freq: Two times a day (BID) | ORAL | 0 refills | Status: DC

## 2022-06-28 NOTE — Progress Notes (Signed)
   Subjective:   Patient ID: William [name], male    DOB: Nov 02, 1966, 56 y.o.   MRN: [id number]  HPI The patient is a 56 YO man coming in for possible diverticulitis.   Review of Systems  Constitutional:  Positive for fever.  HENT: Negative.    Eyes: Negative.   Respiratory:  Negative for cough, chest tightness and shortness of breath.   Cardiovascular:  Negative for chest pain, palpitations and leg swelling.  Gastrointestinal:  Positive for abdominal pain. Negative for abdominal distention, constipation, diarrhea, nausea and vomiting.  Musculoskeletal: Negative.   Skin: Negative.   Neurological: Negative.   Psychiatric/Behavioral: Negative.      Objective:  Physical Exam Constitutional:      Appearance: He is well-developed.  HENT:     Head: Normocephalic and atraumatic.  Cardiovascular:     Rate and Rhythm: Normal rate and regular rhythm.  Pulmonary:     Effort: Pulmonary effort is normal. No respiratory distress.     Breath sounds: Normal breath sounds. No wheezing or rales.  Abdominal:     General: Bowel sounds are normal. There is no distension.     Palpations: Abdomen is soft.     Tenderness: There is abdominal tenderness. There is no rebound.  Musculoskeletal:     Cervical back: Normal range of motion.  Skin:    General: Skin is warm and dry.  Neurological:     Mental Status: He is alert and oriented to person, place, and time.     Coordination: Coordination normal.     Vitals:   06/28/22 1309  BP: 124/80  Pulse: 83  Resp: 18  SpO2: 98%  Weight: 255 lb 9.6 oz (115.9 kg)  Height: '6\' 2"'$  (1.88 m)    Assessment & Plan:

## 2022-06-28 NOTE — Assessment & Plan Note (Signed)
With fevers and pain sounds typical. He has past CT scan with diverticulitis during another flare. Rx augmentin 1 pill twice a day for 1 week. Counseled about diet.

## 2022-06-28 NOTE — Patient Instructions (Addendum)
We have sent in augmentin to take 1 pill twice a day for 1 week.

## 2022-08-24 ENCOUNTER — Encounter: Payer: Self-pay | Admitting: Internal Medicine

## 2022-09-27 ENCOUNTER — Other Ambulatory Visit (INDEPENDENT_AMBULATORY_CARE_PROVIDER_SITE_OTHER): Payer: Managed Care, Other (non HMO)

## 2022-09-27 ENCOUNTER — Encounter: Payer: Self-pay | Admitting: Internal Medicine

## 2022-09-27 DIAGNOSIS — E538 Deficiency of other specified B group vitamins: Secondary | ICD-10-CM

## 2022-09-27 DIAGNOSIS — E1165 Type 2 diabetes mellitus with hyperglycemia: Secondary | ICD-10-CM | POA: Diagnosis not present

## 2022-09-27 DIAGNOSIS — E559 Vitamin D deficiency, unspecified: Secondary | ICD-10-CM | POA: Diagnosis not present

## 2022-09-27 LAB — LIPID PANEL
Cholesterol: 182 mg/dL (ref 0–200)
HDL: 38 mg/dL — ABNORMAL LOW (ref >39.00)
NonHDL: 143.61
Total CHOL/HDL Ratio: 5
Triglycerides: 324 mg/dL — ABNORMAL HIGH (ref 0.0–149.0)
VLDL: 64.8 mg/dL — ABNORMAL HIGH (ref 0.0–40.0)

## 2022-09-27 LAB — BASIC METABOLIC PANEL
BUN: 15 mg/dL (ref 6–23)
CO2: 28 mEq/L (ref 19–32)
Calcium: 9.2 mg/dL (ref 8.4–10.5)
Chloride: 100 mEq/L (ref 96–112)
Creatinine, Ser: 0.96 mg/dL (ref 0.40–1.50)
GFR: 86.95 mL/min (ref >60.00)
Glucose, Bld: 146 mg/dL — ABNORMAL HIGH (ref 70–99)
Potassium: 3.8 mEq/L (ref 3.5–5.1)
Sodium: 135 mEq/L (ref 135–145)

## 2022-09-27 LAB — HEPATIC FUNCTION PANEL
ALT: 44 U/L (ref 0–53)
AST: 36 U/L (ref 0–37)
Albumin: 4.4 g/dL (ref 3.5–5.2)
Alkaline Phosphatase: 56 U/L (ref 39–117)
Bilirubin, Direct: 0.1 mg/dL (ref 0.0–0.3)
Total Bilirubin: 0.6 mg/dL (ref 0.2–1.2)
Total Protein: 7.4 g/dL (ref 6.0–8.3)

## 2022-09-27 LAB — VITAMIN D 25 HYDROXY (VIT D DEFICIENCY, FRACTURES): VITD: 28.05 ng/mL — ABNORMAL LOW (ref 30.00–100.00)

## 2022-09-27 LAB — LDL CHOLESTEROL, DIRECT: Direct LDL: 81 mg/dL

## 2022-09-27 LAB — HEMOGLOBIN A1C: Hgb A1c MFr Bld: 7.2 % — ABNORMAL HIGH (ref 4.6–6.5)

## 2022-09-27 LAB — VITAMIN B12: Vitamin B-12: 426 pg/mL (ref 211–911)

## 2022-12-09 ENCOUNTER — Other Ambulatory Visit: Payer: Self-pay | Admitting: Internal Medicine

## 2022-12-09 ENCOUNTER — Other Ambulatory Visit (INDEPENDENT_AMBULATORY_CARE_PROVIDER_SITE_OTHER): Payer: Managed Care, Other (non HMO)

## 2022-12-09 DIAGNOSIS — E538 Deficiency of other specified B group vitamins: Secondary | ICD-10-CM

## 2022-12-09 DIAGNOSIS — E559 Vitamin D deficiency, unspecified: Secondary | ICD-10-CM | POA: Diagnosis not present

## 2022-12-09 DIAGNOSIS — E1165 Type 2 diabetes mellitus with hyperglycemia: Secondary | ICD-10-CM | POA: Diagnosis not present

## 2022-12-09 DIAGNOSIS — Z125 Encounter for screening for malignant neoplasm of prostate: Secondary | ICD-10-CM

## 2022-12-09 DIAGNOSIS — F339 Major depressive disorder, recurrent, unspecified: Secondary | ICD-10-CM

## 2022-12-09 LAB — BASIC METABOLIC PANEL
BUN: 16 mg/dL (ref 6–23)
CO2: 27 mEq/L (ref 19–32)
Calcium: 9.4 mg/dL (ref 8.4–10.5)
Chloride: 100 mEq/L (ref 96–112)
Creatinine, Ser: 1.21 mg/dL (ref 0.40–1.50)
GFR: 65.77 mL/min (ref >60.00)
Glucose, Bld: 146 mg/dL — ABNORMAL HIGH (ref 70–99)
Potassium: 4.9 mEq/L (ref 3.5–5.1)
Sodium: 137 mEq/L (ref 135–145)

## 2022-12-09 LAB — MICROALBUMIN / CREATININE URINE RATIO
Creatinine,U: 88.2 mg/dL
Microalb Creat Ratio: 1.7 mg/g (ref 0.0–30.0)
Microalb, Ur: 1.5 mg/dL (ref 0.0–1.9)

## 2022-12-09 LAB — CBC WITH DIFFERENTIAL/PLATELET
Basophils Absolute: 0.1 10*3/uL (ref 0.0–0.1)
Basophils Relative: 1 % (ref 0.0–3.0)
Eosinophils Absolute: 0.2 10*3/uL (ref 0.0–0.7)
Eosinophils Relative: 3 % (ref 0.0–5.0)
HCT: 49.2 % (ref 39.0–52.0)
Hemoglobin: 16.9 g/dL (ref 13.0–17.0)
Lymphocytes Relative: 39.2 % (ref 12.0–46.0)
Lymphs Abs: 3.1 10*3/uL (ref 0.7–4.0)
MCHC: 34.3 g/dL (ref 30.0–36.0)
MCV: 85.1 fl (ref 78.0–100.0)
Monocytes Absolute: 0.6 10*3/uL (ref 0.1–1.0)
Monocytes Relative: 8 % (ref 3.0–12.0)
Neutro Abs: 3.9 10*3/uL (ref 1.4–7.7)
Neutrophils Relative %: 48.8 % (ref 43.0–77.0)
Platelets: 170 10*3/uL (ref 150.0–400.0)
RBC: 5.78 Mil/uL (ref 4.22–5.81)
RDW: 14 % (ref 11.5–15.5)
WBC: 7.9 10*3/uL (ref 4.0–10.5)

## 2022-12-09 LAB — URINALYSIS, ROUTINE W REFLEX MICROSCOPIC
Bilirubin Urine: NEGATIVE
Hgb urine dipstick: NEGATIVE
Ketones, ur: NEGATIVE
Leukocytes,Ua: NEGATIVE
Nitrite: NEGATIVE
RBC / HPF: NONE SEEN (ref 0–?)
Specific Gravity, Urine: 1.015 (ref 1.000–1.030)
Total Protein, Urine: NEGATIVE
Urine Glucose: >=1000 — AB
Urobilinogen, UA: 0.2 (ref 0.0–1.0)
WBC, UA: NONE SEEN (ref 0–?)
pH: 6 (ref 5.0–8.0)

## 2022-12-09 LAB — HEPATIC FUNCTION PANEL
ALT: 48 U/L (ref 0–53)
AST: 41 U/L — ABNORMAL HIGH (ref 0–37)
Albumin: 4.6 g/dL (ref 3.5–5.2)
Alkaline Phosphatase: 65 U/L (ref 39–117)
Bilirubin, Direct: 0.1 mg/dL (ref 0.0–0.3)
Total Bilirubin: 0.6 mg/dL (ref 0.2–1.2)
Total Protein: 7.2 g/dL (ref 6.0–8.3)

## 2022-12-09 LAB — LIPID PANEL
Cholesterol: 182 mg/dL (ref 0–200)
HDL: 36.2 mg/dL — ABNORMAL LOW (ref >39.00)
NonHDL: 145.9
Total CHOL/HDL Ratio: 5
Triglycerides: 267 mg/dL — ABNORMAL HIGH (ref 0.0–149.0)
VLDL: 53.4 mg/dL — ABNORMAL HIGH (ref 0.0–40.0)

## 2022-12-09 LAB — PSA: PSA: 0.35 ng/mL (ref 0.10–4.00)

## 2022-12-09 LAB — VITAMIN D 25 HYDROXY (VIT D DEFICIENCY, FRACTURES): VITD: 23.32 ng/mL — ABNORMAL LOW (ref 30.00–100.00)

## 2022-12-09 LAB — TSH: TSH: 5.65 u[IU]/mL — ABNORMAL HIGH (ref 0.35–5.50)

## 2022-12-09 LAB — VITAMIN B12: Vitamin B-12: 525 pg/mL (ref 211–911)

## 2022-12-09 LAB — HEMOGLOBIN A1C: Hgb A1c MFr Bld: 7.2 % — ABNORMAL HIGH (ref 4.6–6.5)

## 2022-12-09 LAB — LDL CHOLESTEROL, DIRECT: Direct LDL: 83 mg/dL

## 2022-12-12 ENCOUNTER — Ambulatory Visit: Payer: Managed Care, Other (non HMO) | Admitting: Internal Medicine

## 2022-12-12 ENCOUNTER — Encounter: Payer: Self-pay | Admitting: Internal Medicine

## 2022-12-12 VITALS — BP 130/78 | HR 95 | Temp 99.2°F | Ht 74.0 in | Wt 268.0 lb

## 2022-12-12 DIAGNOSIS — Z23 Encounter for immunization: Secondary | ICD-10-CM | POA: Diagnosis not present

## 2022-12-12 DIAGNOSIS — E7800 Pure hypercholesterolemia, unspecified: Secondary | ICD-10-CM | POA: Diagnosis not present

## 2022-12-12 DIAGNOSIS — I10 Essential (primary) hypertension: Secondary | ICD-10-CM | POA: Diagnosis not present

## 2022-12-12 DIAGNOSIS — E1165 Type 2 diabetes mellitus with hyperglycemia: Secondary | ICD-10-CM | POA: Diagnosis not present

## 2022-12-12 DIAGNOSIS — Z0001 Encounter for general adult medical examination with abnormal findings: Secondary | ICD-10-CM

## 2022-12-12 DIAGNOSIS — Z86010 Personal history of colonic polyps: Secondary | ICD-10-CM

## 2022-12-12 DIAGNOSIS — E559 Vitamin D deficiency, unspecified: Secondary | ICD-10-CM | POA: Diagnosis not present

## 2022-12-12 MED ORDER — SEMAGLUTIDE (1 MG/DOSE) 4 MG/3ML ~~LOC~~ SOPN: 1.0000 mg | PEN_INJECTOR | SUBCUTANEOUS | 3 refills | Status: DC

## 2022-12-12 NOTE — Assessment & Plan Note (Signed)
Also for colonoscopy as is due 

## 2022-12-12 NOTE — Assessment & Plan Note (Signed)
Age and sex appropriate education and counseling updated with regular exercise and diet Referrals for preventative services - for colonoscopy Immunizations addressed - for shingrix and tdap at the pharmacy, for [place] 20 today Smoking counseling  - none needed Evidence for depression or other mood disorder - anxiety stable overall Most recent labs reviewed. I have personally reviewed and have noted: 1) the patient's medical and social history 2) The patient's current medications and supplements 3) The patient's height, weight, and BMI have been recorded in the chart

## 2022-12-12 NOTE — Assessment & Plan Note (Signed)
Last vitamin D Lab Results  Component Value Date   VD25OH 23.32 (L) 12/09/2022   Low, to start oral replacement

## 2022-12-12 NOTE — Progress Notes (Signed)
Patient ID: William [name], male   DOB: 23-Mar-1966, 57 y.o.   MRN: [id number]         Chief Complaint:: wellness exam and 79mofollow up (Discuss meds, neuropathy pain in toes) , dm, hld, low vit d       HPI:  William LINDEMANis a 57y.o. male here for wellness exam; plans to have coionoscopy soon, but will have shingrix and tdap at the pharmacy, for prevnar today, o/w up to date                    Also metoprolol tolerating well without controlled previuos palpitatoins.  Has regained some wt recently, had some exercise 1 hr per day when losing wt, but less active lately, mother in law with dementia and passed recently.  He had wife were quite involved.  Has 6 mo worsening mild numbness and discomfort to all toes.  Pt denies chest pain, increased sob or doe, wheezing, orthopnea, PND, increased LE swelling, palpitations, dizziness or syncope.   Pt denies polydipsia, polyuria, or new focal neuro s/s.    Pt denies fever, wt loss, night sweats, loss of appetite, or other constitutional symptoms   Not taking VIT D.  Denies worsening depressive symptoms, suicidal ideation, or panic; has ongoing anxiety   Wt Readings from Last 3 Encounters:  12/12/22 268 lb (121.6 kg)  06/28/22 255 lb 9.6 oz (115.9 kg)  06/06/22 270 lb (122.5 kg)   BP Readings from Last 3 Encounters:  12/12/22 130/78  06/28/22 124/80  06/06/22 140/78   Immunization History  Administered Date(s) Administered   PNEUMOCOCCAL CONJUGATE-20 12/12/2022   Td 12/06/2003   Health Maintenance Due  Topic Date Due   DTaP/Tdap/Td (2 - Tdap) 12/05/2013      Past Medical History:  Diagnosis Date   Anxiety    Degenerative arthritis of lumbar spine 12/12/2013   Diverticulitis    Encounter for well adult exam with abnormal findings 07/12/2013   GERD (gastroesophageal reflux disease)    Hypertension    Sleep apnea    Past Surgical History:  Procedure Laterality Date   COLONOSCOPY     WISDOM TOOTH EXTRACTION      reports that he has never  smoked. He has never used smokeless tobacco. He reports current alcohol use. He reports that he does not use drugs. family history includes Cancer in his mother; Hyperlipidemia in his mother. Allergies  Allergen Reactions   Acetaminophen     Other reaction(s): Other (See Comments) "made me feel misplaced"   Blue Dyes (Parenteral)     Other reaction(s): Other (See Comments) Pt states he is "just allergic to dyes" Unknown as to what dye that is.  "made my skin break out in hives"   Ivp Dye [Iodinated Contrast Media] Hives    Per pt after CT with IV contrast aug 2014   Metformin And Related Diarrhea   Niacin    Current Outpatient Medications on File Prior to Visit  Medication Sig Dispense Refill   Cholecalciferol (VITAMIN D PO) Take by mouth.     citalopram (CELEXA) 10 MG tablet Take 1 tablet (10 mg total) by mouth daily. 90 tablet 3   dapagliflozin propanediol (FARXIGA) 10 MG TABS tablet Take 1 tablet (10 mg total) by mouth daily before breakfast. 90 tablet 3   Magnesium Hydroxide (MAGNESIA PO) Take by mouth.     metoprolol succinate (TOPROL-XL) 50 MG 24 hr tablet Take 1 tablet (50 mg total) by  mouth daily. Take with or immediately following a meal. 90 tablet 3   sildenafil (VIAGRA) 100 MG tablet Take 0.5-1 tablets (50-100 mg total) by mouth daily as needed for erectile dysfunction. 5 tablet 11   No current facility-administered medications on file prior to visit.        ROS:  All others reviewed and negative.  Objective        PE:  BP 130/78 (BP Location: Left Arm, Patient Position: Sitting, Cuff Size: Large)   Pulse 95   Temp 99.2 F (37.3 C) (Oral)   Ht '6\' 2"'$  (1.88 m)   Wt 268 lb (121.6 kg)   SpO2 96%   BMI 34.41 kg/m                 Constitutional: Pt appears in NAD               HENT: Head: NCAT.                Right Ear: External ear normal.                 Left Ear: External ear normal.                Eyes: . Pupils are equal, round, and reactive to light.  Conjunctivae and EOM are normal               Nose: without d/c or deformity               Neck: Neck supple. Gross normal ROM               Cardiovascular: Normal rate and regular rhythm.                 Pulmonary/Chest: Effort normal and breath sounds without rales or wheezing.                Abd:  Soft, NT, ND, + BS, no organomegaly               Neurological: Pt is alert. At baseline orientation, motor grossly intact               Skin: Skin is warm. No rashes, no other new lesions, LE edema - none               Psychiatric: Pt behavior is normal without agitation   Micro: none  Cardiac tracings I have personally interpreted today:  none  Pertinent Radiological findings (summarize): none   Lab Results  Component Value Date   WBC 7.9 12/09/2022   HGB 16.9 12/09/2022   HCT 49.2 12/09/2022   PLT 170.0 12/09/2022   GLUCOSE 146 (H) 12/09/2022   CHOL 182 12/09/2022   TRIG 267.0 (H) 12/09/2022   HDL 36.20 (L) 12/09/2022   LDLDIRECT 83.0 12/09/2022   LDLCALC 107 (H) 03/27/2020   ALT 48 12/09/2022   AST 41 (H) 12/09/2022   NA 137 12/09/2022   K 4.9 12/09/2022   CL 100 12/09/2022   CREATININE 1.21 12/09/2022   BUN 16 12/09/2022   CO2 27 12/09/2022   TSH 5.65 (H) 12/09/2022   PSA 0.35 12/09/2022   HGBA1C 7.2 (H) 12/09/2022   MICROALBUR 1.5 12/09/2022   Assessment/Plan:  William [name] is a 57 y.o. White or Caucasian [1] male with  has a past medical history of Anxiety, Degenerative arthritis of lumbar spine (12/12/2013), Diverticulitis, Encounter for well adult exam with abnormal findings (07/12/2013), GERD (gastroesophageal reflux  disease), Hypertension, and Sleep apnea.  Vitamin D deficiency Last vitamin D Lab Results  Component Value Date   VD25OH 23.32 (L) 12/09/2022   Low, to start oral replacement   Encounter for well adult exam with abnormal findings Age and sex appropriate education and counseling updated with regular exercise and diet Referrals for preventative  services - for colonoscopy Immunizations addressed - for shingrix and tdap at the pharmacy, for [place] 20 today Smoking counseling  - none needed Evidence for depression or other mood disorder - anxiety stable overall Most recent labs reviewed. I have personally reviewed and have noted: 1) the patient's medical and social history 2) The patient's current medications and supplements 3) The patient's height, weight, and BMI have been recorded in the chart   Diabetes [facility]) Lab Results  Component Value Date   HGBA1C 7.2 (H) 12/09/2022   Persistent uncontrolled, for increased ozempic 1 mg weekly, , pt to continue current medical treatment farxiga 10 mg qd   Essential hypertension BP Readings from Last 3 Encounters:  12/12/22 130/78  06/28/22 124/80  06/06/22 140/78   Stable, pt to continue medical treatment toprol xl 50 mg qd   Hyperlipidemia Lab Results  Component Value Date   LDLCALC 107 (H) 03/27/2020   Uncontrolled, goal Ldl < 70,, pt to continue low chol diet, declines statin for now   History of colonic polyps Also for colonoscopy as is due  Followup: Return in about 6 months (around 06/12/2023).  Cathlean Cower, MD 12/12/2022 12:59 PM [facility]

## 2022-12-12 NOTE — Assessment & Plan Note (Signed)
Lab Results  Component Value Date   LDLCALC 107 (H) 03/27/2020   Uncontrolled, goal Ldl < 70,, pt to continue low chol diet, declines statin for now

## 2022-12-12 NOTE — Patient Instructions (Addendum)
Please have your Shingrix (shingles) shots done at your local pharmacy., and the Tdap tetanus shot as well  You had the Prevnar pneumonia shot today  You will be contacted regarding the referral for: colonoscopy  Ok to increase the ozempic to 1 mg weekly  Please take OTC Vitamin D3 at 2000 units per day, indefinitely  Please continue all other medications as before, and refills have been done if requested.  Please have the pharmacy call with any other refills you may need.  Please continue your efforts at being more active, low cholesterol diet, and weight control.  You are otherwise up to date with prevention measures today.  Please keep your appointments with your specialists as you may have planned  Please make an Appointment to return in 6 months, or sooner if needed, also with Lab Appointment for testing done 3-5 days before at the [facility] (so this is for TWO appointments - please see the scheduling desk as you leave)

## 2022-12-12 NOTE — Assessment & Plan Note (Signed)
Lab Results  Component Value Date   HGBA1C 7.2 (H) 12/09/2022   Persistent uncontrolled, for increased ozempic 1 mg weekly, , pt to continue current medical treatment farxiga 10 mg qd

## 2022-12-12 NOTE — Assessment & Plan Note (Signed)
BP Readings from Last 3 Encounters:  12/12/22 130/78  06/28/22 124/80  06/06/22 140/78   Stable, pt to continue medical treatment toprol xl 50 mg qd

## 2023-01-30 ENCOUNTER — Encounter: Payer: Self-pay | Admitting: Family Medicine

## 2023-01-30 ENCOUNTER — Ambulatory Visit: Payer: Managed Care, Other (non HMO) | Admitting: Family Medicine

## 2023-01-30 VITALS — BP 118/80 | HR 90 | Temp 98.2°F | Resp 18 | Ht 74.0 in | Wt 265.0 lb

## 2023-01-30 DIAGNOSIS — E1165 Type 2 diabetes mellitus with hyperglycemia: Secondary | ICD-10-CM | POA: Diagnosis not present

## 2023-01-30 DIAGNOSIS — K5792 Diverticulitis of intestine, part unspecified, without perforation or abscess without bleeding: Secondary | ICD-10-CM | POA: Diagnosis not present

## 2023-01-30 MED ORDER — AMOXICILLIN-POT CLAVULANATE 875-125 MG PO TABS: 1.0000 | ORAL_TABLET | Freq: Two times a day (BID) | ORAL | 0 refills | Status: AC

## 2023-01-30 NOTE — Progress Notes (Signed)
Assessment & Plan:  1. Diverticulitis Education provided on diverticulitis. Encouraged patient to reschedule colonoscopy ASAP.  - amoxicillin-clavulanate (AUGMENTIN) 875-125 MG tablet; Take 1 tablet by mouth 2 (two) times daily for 7 days.  Dispense: 14 tablet; Refill: 0  2. Type 2 diabetes mellitus with hyperglycemia, without long-term current use of insulin ([place]) Encouraged patient to discuss medication alternatives, such as Mounjaro, with his PCP as it seems to have less GI side effects.    Follow up plan: Return if symptoms worsen or fail to improve.  Hendricks Limes, MSN, APRN, FNP-C  Subjective:  HPI: William [name] is a 57 y.o. male presenting on 01/30/2023 for Abdominal Pain (Hx of diverticulosis and diverticulitis fare-ups /Has Colon due in April - [facility] )  Patient complains of chills, constipation, diarrhea, and nausea.  The pain is described as aching and sharp, and is 6/10 in intensity. Onset was 3 days ago. Symptoms have been gradually worsening since. The patient denies diffuse abdominal pain, fever, and melena.  His last flareup of diverticulitis was in July 2023, prior to that it has been years.  He is overdue for his colonoscopy by almost a year.  This was scheduled to be repeated in 2 weeks that he had to cancel as he will be out of town.  Additionally, patient is concerned about decreased appetite and chronic nausea (different than current nausea) since starting Ozempic almost a year ago.   ROS: Negative unless specifically indicated above in HPI.   Relevant past medical history reviewed and updated as indicated.   Allergies and medications reviewed and updated.   Current Outpatient Medications:    Cholecalciferol (VITAMIN D PO), Take by mouth., Disp: , Rfl:    dapagliflozin propanediol (FARXIGA) 10 MG TABS tablet, Take 1 tablet (10 mg total) by mouth daily before breakfast., Disp: 90 tablet, Rfl: 3   Magnesium Hydroxide (MAGNESIA PO), Take by mouth., Disp: ,  Rfl:    metoprolol succinate (TOPROL-XL) 50 MG 24 hr tablet, Take 1 tablet (50 mg total) by mouth daily. Take with or immediately following a meal., Disp: 90 tablet, Rfl: 3   Semaglutide, 1 MG/DOSE, 4 MG/3ML SOPN, Inject 1 mg as directed once a week., Disp: 9 mL, Rfl: 3   sildenafil (VIAGRA) 100 MG tablet, Take 0.5-1 tablets (50-100 mg total) by mouth daily as needed for erectile dysfunction., Disp: 5 tablet, Rfl: 11  Allergies  Allergen Reactions   Acetaminophen     Other reaction(s): Other (See Comments) "made me feel misplaced"   Blue Dyes (Parenteral)     Other reaction(s): Other (See Comments) Pt states he is "just allergic to dyes" Unknown as to what dye that is.  "made my skin break out in hives"   Ivp Dye [Iodinated Contrast Media] Hives    Per pt after CT with IV contrast aug 2014   Metformin And Related Diarrhea   Niacin     Objective:   BP 118/80   Pulse 90   Temp 98.2 F (36.8 C)   Resp 18   Ht '6\' 2"'$  (1.88 m)   Wt 265 lb (120.2 kg)   BMI 34.02 kg/m    Physical Exam Vitals reviewed.  Constitutional:      General: He is not in acute distress.    Appearance: Normal appearance. He is not ill-appearing, toxic-appearing or diaphoretic.  HENT:     Head: Normocephalic and atraumatic.  Eyes:     General: No scleral icterus.  Right eye: No discharge.        Left eye: No discharge.     Conjunctiva/sclera: Conjunctivae normal.  Cardiovascular:     Rate and Rhythm: Normal rate.  Pulmonary:     Effort: Pulmonary effort is normal. No respiratory distress.  Abdominal:     General: Bowel sounds are normal.     Palpations: Abdomen is soft. There is no shifting dullness, fluid wave, hepatomegaly, splenomegaly, mass or pulsatile mass.     Tenderness: There is abdominal tenderness in the suprapubic area and left lower quadrant.  Musculoskeletal:        General: Normal range of motion.     Cervical back: Normal range of motion.  Skin:    General: Skin is warm and  dry.  Neurological:     Mental Status: He is alert and oriented to person, place, and time. Mental status is at baseline.  Psychiatric:        Mood and Affect: Mood normal.        Behavior: Behavior normal.        Thought Content: Thought content normal.        Judgment: Judgment normal.

## 2023-01-30 NOTE — Patient Instructions (Addendum)
Please reschedule your colonoscopy ASAP.  Mounjaro instead of Ozempic.

## 2023-02-01 ENCOUNTER — Telehealth: Payer: Self-pay | Admitting: Internal Medicine

## 2023-02-01 MED ORDER — ONDANSETRON 4 MG PO TBDP: 4.0000 mg | ORAL_TABLET | Freq: Three times a day (TID) | ORAL | 0 refills | Status: DC | PRN

## 2023-02-01 NOTE — Telephone Encounter (Signed)
Patient has had nausea since Ozempic dosage increase. He would like to know if there is anything he can take to help with the nausea. Best callback is [phone].

## 2023-02-01 NOTE — Telephone Encounter (Signed)
Ok for zofran prn, but we may need to reduce the dose if the nausea does ont improve

## 2023-02-01 NOTE — Telephone Encounter (Signed)
Please advise, patient is experiencing nausea since increasing Ozempic

## 2023-02-02 NOTE — Telephone Encounter (Signed)
Patient has started the zofran and is feeling much better, he will inform us of any changes

## 2023-02-24 ENCOUNTER — Encounter: Payer: Self-pay | Admitting: Internal Medicine

## 2023-03-17 ENCOUNTER — Ambulatory Visit (AMBULATORY_SURGERY_CENTER): Payer: Managed Care, Other (non HMO)

## 2023-03-17 VITALS — Ht 74.0 in | Wt 247.0 lb

## 2023-03-17 DIAGNOSIS — Z1211 Encounter for screening for malignant neoplasm of colon: Secondary | ICD-10-CM

## 2023-03-17 DIAGNOSIS — Z86010 Personal history of colonic polyps: Secondary | ICD-10-CM

## 2023-03-17 MED ORDER — NA SULFATE-K SULFATE-MG SULF 17.5-3.13-1.6 GM/177ML PO SOLN: 1.0000 | Freq: Once | ORAL | 0 refills | Status: AC

## 2023-03-17 NOTE — Progress Notes (Signed)
Pre visit completed via phone call; Patient verified name, DOB, and address;  No egg or soy allergy known to patient;  No issues known to pt with past sedation with any surgeries or procedures; Patient denies ever being told they had issues or difficulty with intubation;  No FH of Malignant Hyperthermia; Pt is not on diet pills; Pt is not on home 02;  Pt is not on blood thinners;  Pt denies issues with constipation;  No A fib or A flutter; Have any cardiac testing pending--NO Pt instructed to use [email or web address] or GoodRx for a price reduction on prep;   Insurance verified during PV appt=[employer]  Patient's chart reviewed by Cathlyn Parsons CNRA prior to previsit and patient appropriate for the [facility].  Previsit completed and red dot placed by patient's name on their procedure day (on provider's schedule).    Instructions and GoodRx [facility] coupon printed and mailed to the patient;

## 2023-04-11 ENCOUNTER — Encounter: Payer: Self-pay | Admitting: Internal Medicine

## 2023-04-19 ENCOUNTER — Encounter: Payer: Self-pay | Admitting: Internal Medicine

## 2023-04-19 ENCOUNTER — Ambulatory Visit: Payer: Managed Care, Other (non HMO) | Admitting: Internal Medicine

## 2023-04-19 VITALS — BP 111/78 | HR 67 | Temp 98.4°F | Resp 17 | Ht 74.0 in | Wt 247.0 lb

## 2023-04-19 DIAGNOSIS — Z86010 Personal history of colonic polyps: Secondary | ICD-10-CM | POA: Diagnosis not present

## 2023-04-19 DIAGNOSIS — D122 Benign neoplasm of ascending colon: Secondary | ICD-10-CM | POA: Diagnosis not present

## 2023-04-19 DIAGNOSIS — Z1211 Encounter for screening for malignant neoplasm of colon: Secondary | ICD-10-CM

## 2023-04-19 DIAGNOSIS — Z09 Encounter for follow-up examination after completed treatment for conditions other than malignant neoplasm: Secondary | ICD-10-CM | POA: Diagnosis present

## 2023-04-19 MED ORDER — SODIUM CHLORIDE 0.9 % IV SOLN
500.0000 mL | INTRAVENOUS | Status: DC
Start: 2023-04-19 — End: 2023-04-19

## 2023-04-19 NOTE — Op Note (Signed)
[facility] Patient Name: William [name] Procedure Date: 04/19/2023 11:27 AM MRN: [id number] Endoscopist: Wilhemina Bonito. Marina Goodell , MD, [id number] Age: 57 Referring MD:  Date of Birth: 11-12-1966 Gender: Male Account #: [id number] Procedure:                Colonoscopy with cold snare polypectomy x 1; biopsy                            polypectomy x 1 Indications:              High risk colon cancer surveillance: Personal                            history of multiple (3 or more) adenomas. Previous                            examinations 2005, 2009, 2014; (Dr. Arlyce Dice); 2018                            (Dr. Marina Goodell) Medicines:                Monitored Anesthesia Care Procedure:                Pre-Anesthesia Assessment:                           - Prior to the procedure, a History and Physical                            was performed, and patient medications and                            allergies were reviewed. The patient's tolerance of                            previous anesthesia was also reviewed. The risks                            and benefits of the procedure and the sedation                            options and risks were discussed with the patient.                            All questions were answered, and informed consent                            was obtained. Prior Anticoagulants: The patient has                            taken no anticoagulant or antiplatelet agents. ASA                            Grade Assessment: II - A patient with mild systemic  disease. After reviewing the risks and benefits,                            the patient was deemed in satisfactory condition to                            undergo the procedure.                           After obtaining informed consent, the colonoscope                            was passed under direct vision. Throughout the                            procedure, the patient's blood pressure, pulse,  and                            oxygen saturations were monitored continuously. The                            Olympus CF-[id number] SN [id number] was introduced through                            the anus and advanced to the the cecum, identified                            by appendiceal orifice and ileocecal valve. The                            ileocecal valve, appendiceal orifice, and rectum                            were photographed. The quality of the bowel                            preparation was excellent. The colonoscopy was                            performed without difficulty. The patient tolerated                            the procedure well. The bowel preparation used was                            SUPREP via split dose instruction. Scope In: 11:40:38 AM Scope Out: 11:59:16 AM Scope Withdrawal Time: 0 hours 11 minutes 46 seconds  Total Procedure Duration: 0 hours 18 minutes 38 seconds  Findings:                 A 3 mm polyp was found in the ascending colon. The                            polyp was removed with a cold snare.  Resection and                            retrieval were complete.                           A 1 mm polyp was found in the ascending colon. The                            polyp was removed with a jumbo cold forceps.                            Resection and retrieval were complete.                           Multiple diverticula were found in the entire colon.                           Internal hemorrhoids were found during retroflexion.                           The exam was otherwise without abnormality on                            direct and retroflexion views. The colon is                            redundant. Complications:            No immediate complications. Estimated blood loss:                            None. Estimated Blood Loss:     Estimated blood loss: none. Impression:               - One 3 mm polyp in the ascending colon, removed                             with a cold snare. Resected and retrieved.                           - One 1 mm polyp in the ascending colon, removed                            with a jumbo cold forceps. Resected and retrieved.                           - Diverticulosis in the entire examined colon.                           - Internal hemorrhoids.                           - The examination was otherwise normal on direct  and retroflexion views. REDUNDANT COLON Recommendation:           - Repeat colonoscopy in 5 years for surveillance                            (history of multiple adenomatous polyps).                           - Patient has a contact number available for                            emergencies. The signs and symptoms of potential                            delayed complications were discussed with the                            patient. Return to normal activities tomorrow.                            Written discharge instructions were provided to the                            patient.                           - Resume previous diet.                           - Continue present medications.                           - Await pathology results. Wilhemina Bonito. Marina Goodell, MD 04/19/2023 12:06:59 PM This report has been signed electronically.

## 2023-04-19 NOTE — Progress Notes (Signed)
HISTORY OF PRESENT ILLNESS:  William [name] is a 57 y.o. male with a history of multiple adenomatous colon polyps.  Previous examinations 2005, 2009, 2014 (Dr. Arlyce [name]); 2018.  Now for surveillance colonoscopy.  REVIEW OF SYSTEMS:  All non-GI ROS negative except for  Past Medical History:  Diagnosis Date   Anxiety    Degenerative arthritis of lumbar spine 12/12/2013   Diabetes mellitus without complication (HCC)    on meds   Diverticulitis    Encounter for well adult exam with abnormal findings 07/12/2013   GERD (gastroesophageal reflux disease)    Hypertension    on meds   Sleep apnea    uses CPAP nightly    Past Surgical History:  Procedure Laterality Date   COLONOSCOPY  2018   JP-MAC-suprep (exc)-elongated redundant colon/int hem/tics/4 polyps   WISDOM TOOTH EXTRACTION      Social History [name]  reports that he has never smoked. He has never used smokeless tobacco. He reports current alcohol use. He reports that he does not use drugs.  family history includes Hyperlipidemia in his mother; Ovarian cancer (age of onset: 57) in his mother; Pancreatic cancer in his father.  Allergies  Allergen Reactions   Acetaminophen     Other reaction(s): Other (See Comments) "made me feel misplaced"   Blue Dyes (Parenteral)     Other reaction(s): Other (See Comments) Pt states he is "just allergic to dyes" Unknown as to what dye that is.  "made my skin break out in hives"   Ivp Dye [Iodinated Contrast Media] Hives    Per pt after CT with IV contrast aug 2014   Metformin And Related Diarrhea   Niacin        PHYSICAL EXAMINATION: Vital signs: BP (!) 129/90   Pulse 84   Temp 98.4 F (36.9 C)   Ht 6\' 2"  (1.88 m)   Wt 247 lb (112 kg)   SpO2 97%   BMI 31.71 kg/m  General: Well-developed, well-nourished, no acute distress HEENT: Sclerae are anicteric, conjunctiva pink. Oral mucosa intact Lungs: Clear Heart: Regular Abdomen: soft, nontender, nondistended, no  obvious ascites, no peritoneal signs, normal bowel sounds. No organomegaly. Extremities: No edema Psychiatric: alert and oriented x3. Cooperative     ASSESSMENT:  History of multiple adenomatous colon polyps   PLAN:   Surveillance colonoscopy

## 2023-04-19 NOTE — Progress Notes (Signed)
Called to room to assist during endoscopic procedure.  Patient ID and intended procedure confirmed with present staff. Received instructions for my participation in the procedure from the performing physician.  

## 2023-04-19 NOTE — Patient Instructions (Addendum)
2 polyps removed and sent to pathology.  Diverticulosis and hemorrhoids)                            - Repeat colonoscopy in 5 years for surveillance                            (history of multiple adenomatous polyps).                           - Resume previous diet.                           - Continue present medications.                           - Await pathology results.  YOU HAD AN ENDOSCOPIC PROCEDURE TODAY AT THE [facility]:   Refer to the procedure report that was given to you for any specific questions about what was found during the examination.  If the procedure report does not answer your questions, please call your gastroenterologist to clarify.  If you requested that your care partner not be given the details of your procedure findings, then the procedure report has been included in a sealed envelope for you to review at your convenience later.  YOU SHOULD EXPECT: Some feelings of bloating in the abdomen. Passage of more gas than usual.  Walking can help get rid of the air that was put into your GI tract during the procedure and reduce the bloating. If you had a lower endoscopy (such as a colonoscopy or flexible sigmoidoscopy) you may notice spotting of blood in your stool or on the toilet paper. If you underwent a bowel prep for your procedure, you may not have a normal bowel movement for a few days.  Please Note:  You might notice some irritation and congestion in your nose or some drainage.  This is from the oxygen used during your procedure.  There is no need for concern and it should clear up in a day or so.  SYMPTOMS TO REPORT IMMEDIATELY:  Following lower endoscopy (colonoscopy or flexible sigmoidoscopy):  Excessive amounts of blood in the stool  Significant tenderness or worsening of abdominal pains  Swelling of the abdomen that is new, acute  Fever of 100F or higher   For urgent or emergent issues, a gastroenterologist can be reached at any hour by  calling [phone]. Do not use MyChart messaging for urgent concerns.    DIET:  We do recommend a small meal at first, but then you may proceed to your regular diet.  Drink plenty of fluids but you should avoid alcoholic beverages for 24 hours.  ACTIVITY:  You should plan to take it easy for the rest of today and you should NOT DRIVE or use heavy machinery until tomorrow (because of the sedation medicines used during the test).    FOLLOW UP: Our staff will call the number listed on your records the next business day following your procedure.  We will call around 7:15- 8:00 am to check on you and address any questions or concerns that you may have regarding the information given to you following your procedure. If we do not reach you, we will leave a message.     If any  biopsies were taken you will be contacted by phone or by letter within the next 1-3 weeks.  Please call us at [phone] if you have not heard about the biopsies in 3 weeks.    SIGNATURES/CONFIDENTIALITY: You and/or your care partner have signed paperwork which will be entered into your electronic medical record.  These signatures attest to the fact that that the information above on your After Visit Summary has been reviewed and is understood.  Full responsibility of the confidentiality of this discharge information lies with you and/or your care-partner.

## 2023-04-19 NOTE — Progress Notes (Signed)
Vss nad trans to pacu 

## 2023-04-20 ENCOUNTER — Telehealth: Payer: Self-pay | Admitting: *Deleted

## 2023-04-20 NOTE — Telephone Encounter (Signed)
Post procedure follow up call placed, no answer and left VM.  

## 2023-04-21 ENCOUNTER — Encounter: Payer: Self-pay | Admitting: Internal Medicine

## 2023-06-04 ENCOUNTER — Other Ambulatory Visit: Payer: Self-pay | Admitting: Internal Medicine

## 2023-06-05 ENCOUNTER — Other Ambulatory Visit: Payer: Self-pay

## 2023-06-09 ENCOUNTER — Other Ambulatory Visit (INDEPENDENT_AMBULATORY_CARE_PROVIDER_SITE_OTHER): Payer: Managed Care, Other (non HMO)

## 2023-06-09 ENCOUNTER — Telehealth: Payer: Self-pay | Admitting: Urgent Care

## 2023-06-09 DIAGNOSIS — Z125 Encounter for screening for malignant neoplasm of prostate: Secondary | ICD-10-CM

## 2023-06-09 DIAGNOSIS — E1165 Type 2 diabetes mellitus with hyperglycemia: Secondary | ICD-10-CM

## 2023-06-09 DIAGNOSIS — E538 Deficiency of other specified B group vitamins: Secondary | ICD-10-CM | POA: Diagnosis not present

## 2023-06-09 DIAGNOSIS — Z13220 Encounter for screening for lipoid disorders: Secondary | ICD-10-CM

## 2023-06-09 DIAGNOSIS — E559 Vitamin D deficiency, unspecified: Secondary | ICD-10-CM | POA: Diagnosis not present

## 2023-06-09 LAB — VITAMIN D 25 HYDROXY (VIT D DEFICIENCY, FRACTURES): VITD: 20.67 ng/mL — ABNORMAL LOW (ref 30.00–100.00)

## 2023-06-09 LAB — CBC WITH DIFFERENTIAL/PLATELET
Basophils Absolute: 0.1 10*3/uL (ref 0.0–0.1)
Basophils Relative: 1.2 % (ref 0.0–3.0)
Eosinophils Absolute: 0.2 10*3/uL (ref 0.0–0.7)
Eosinophils Relative: 2.2 % (ref 0.0–5.0)
HCT: 52.2 % — ABNORMAL HIGH (ref 39.0–52.0)
Hemoglobin: 17.4 g/dL — ABNORMAL HIGH (ref 13.0–17.0)
Lymphocytes Relative: 41.4 % (ref 12.0–46.0)
Lymphs Abs: 3.6 10*3/uL (ref 0.7–4.0)
MCHC: 33.3 g/dL (ref 30.0–36.0)
MCV: 86.9 fl (ref 78.0–100.0)
Monocytes Absolute: 0.7 10*3/uL (ref 0.1–1.0)
Monocytes Relative: 7.7 % (ref 3.0–12.0)
Neutro Abs: 4.2 10*3/uL (ref 1.4–7.7)
Neutrophils Relative %: 47.5 % (ref 43.0–77.0)
Platelets: 185 10*3/uL (ref 150.0–400.0)
RBC: 6.01 Mil/uL — ABNORMAL HIGH (ref 4.22–5.81)
RDW: 13.6 % (ref 11.5–15.5)
WBC: 8.8 10*3/uL (ref 4.0–10.5)

## 2023-06-09 LAB — BASIC METABOLIC PANEL
BUN: 11 mg/dL (ref 6–23)
CO2: 27 mEq/L (ref 19–32)
Calcium: 9.8 mg/dL (ref 8.4–10.5)
Chloride: 99 mEq/L (ref 96–112)
Creatinine, Ser: 1.01 mg/dL (ref 0.40–1.50)
GFR: 81.41 mL/min (ref >60.00)
Glucose, Bld: 107 mg/dL — ABNORMAL HIGH (ref 70–99)
Potassium: 4.4 mEq/L (ref 3.5–5.1)
Sodium: 137 mEq/L (ref 135–145)

## 2023-06-09 LAB — HEPATIC FUNCTION PANEL
ALT: 25 U/L (ref 0–53)
AST: 23 U/L (ref 0–37)
Albumin: 4.7 g/dL (ref 3.5–5.2)
Alkaline Phosphatase: 61 U/L (ref 39–117)
Bilirubin, Direct: 0.2 mg/dL (ref 0.0–0.3)
Total Bilirubin: 1 mg/dL (ref 0.2–1.2)
Total Protein: 7.5 g/dL (ref 6.0–8.3)

## 2023-06-09 LAB — PSA: PSA: 0.46 ng/mL (ref 0.10–4.00)

## 2023-06-09 LAB — LIPID PANEL
Cholesterol: 192 mg/dL (ref 0–200)
HDL: 33.7 mg/dL — ABNORMAL LOW (ref >39.00)
NonHDL: 158.57
Total CHOL/HDL Ratio: 6
Triglycerides: 400 mg/dL — ABNORMAL HIGH (ref 0.0–149.0)
VLDL: 80 mg/dL — ABNORMAL HIGH (ref 0.0–40.0)

## 2023-06-09 LAB — MICROALBUMIN / CREATININE URINE RATIO
Creatinine,U: 57.3 mg/dL
Microalb Creat Ratio: 1.2 mg/g (ref 0.0–30.0)
Microalb, Ur: <0.7 mg/dL (ref 0.0–1.9)

## 2023-06-09 LAB — URINALYSIS, ROUTINE W REFLEX MICROSCOPIC
Bilirubin Urine: NEGATIVE
Hgb urine dipstick: NEGATIVE
Ketones, ur: NEGATIVE
Leukocytes,Ua: NEGATIVE
Nitrite: NEGATIVE
RBC / HPF: NONE SEEN (ref 0–?)
Specific Gravity, Urine: <=1.005 — AB (ref 1.000–1.030)
Total Protein, Urine: NEGATIVE
Urine Glucose: >=1000 — AB
Urobilinogen, UA: 0.2 (ref 0.0–1.0)
pH: 6 (ref 5.0–8.0)

## 2023-06-09 LAB — VITAMIN B12: Vitamin B-12: 447 pg/mL (ref 211–911)

## 2023-06-09 LAB — LDL CHOLESTEROL, DIRECT: Direct LDL: 73 mg/dL

## 2023-06-09 LAB — HEMOGLOBIN A1C: Hgb A1c MFr Bld: 6.3 % (ref 4.6–6.5)

## 2023-06-09 LAB — TSH: TSH: 3.02 u[IU]/mL (ref 0.35–5.50)

## 2023-06-09 MED ORDER — FENOFIBRATE 145 MG PO TABS: 145.0000 mg | ORAL_TABLET | Freq: Every day | ORAL | 0 refills | Status: DC

## 2023-06-09 NOTE — Telephone Encounter (Signed)
TG's 400. Pt not currently on a fibrate. Will start 145mg  Tricor. Pt to further discuss dietary/ lifestyle changes with PCP.

## 2023-06-10 ENCOUNTER — Other Ambulatory Visit: Payer: Self-pay | Admitting: Internal Medicine

## 2023-06-12 ENCOUNTER — Ambulatory Visit (INDEPENDENT_AMBULATORY_CARE_PROVIDER_SITE_OTHER): Payer: Managed Care, Other (non HMO) | Admitting: Internal Medicine

## 2023-06-12 ENCOUNTER — Encounter: Payer: Self-pay | Admitting: Internal Medicine

## 2023-06-12 ENCOUNTER — Other Ambulatory Visit: Payer: Self-pay

## 2023-06-12 VITALS — BP 124/76 | HR 68 | Temp 98.3°F | Ht 74.0 in | Wt 260.0 lb

## 2023-06-12 DIAGNOSIS — E559 Vitamin D deficiency, unspecified: Secondary | ICD-10-CM | POA: Diagnosis not present

## 2023-06-12 DIAGNOSIS — E1140 Type 2 diabetes mellitus with diabetic neuropathy, unspecified: Secondary | ICD-10-CM | POA: Diagnosis not present

## 2023-06-12 DIAGNOSIS — F339 Major depressive disorder, recurrent, unspecified: Secondary | ICD-10-CM

## 2023-06-12 DIAGNOSIS — I10 Essential (primary) hypertension: Secondary | ICD-10-CM

## 2023-06-12 DIAGNOSIS — Z7985 Long-term (current) use of injectable non-insulin antidiabetic drugs: Secondary | ICD-10-CM

## 2023-06-12 DIAGNOSIS — M79671 Pain in right foot: Secondary | ICD-10-CM

## 2023-06-12 DIAGNOSIS — E7800 Pure hypercholesterolemia, unspecified: Secondary | ICD-10-CM | POA: Diagnosis not present

## 2023-06-12 DIAGNOSIS — Z0001 Encounter for general adult medical examination with abnormal findings: Secondary | ICD-10-CM

## 2023-06-12 DIAGNOSIS — Z7984 Long term (current) use of oral hypoglycemic drugs: Secondary | ICD-10-CM

## 2023-06-12 DIAGNOSIS — Z0000 Encounter for general adult medical examination without abnormal findings: Secondary | ICD-10-CM

## 2023-06-12 DIAGNOSIS — E1165 Type 2 diabetes mellitus with hyperglycemia: Secondary | ICD-10-CM

## 2023-06-12 MED ORDER — DAPAGLIFLOZIN PROPANEDIOL 10 MG PO TABS: 10.0000 mg | ORAL_TABLET | Freq: Every day | ORAL | 3 refills | Status: DC

## 2023-06-12 MED ORDER — METOPROLOL SUCCINATE ER 50 MG PO TB24: 50.0000 mg | ORAL_TABLET | Freq: Every day | ORAL | 3 refills | Status: DC

## 2023-06-12 MED ORDER — FENOFIBRATE 145 MG PO TABS: 145.0000 mg | ORAL_TABLET | Freq: Every day | ORAL | 3 refills | Status: DC

## 2023-06-12 MED ORDER — ONDANSETRON 4 MG PO TBDP: 4.0000 mg | ORAL_TABLET | Freq: Three times a day (TID) | ORAL | 1 refills | Status: DC | PRN

## 2023-06-12 NOTE — Progress Notes (Signed)
Patient ID: William [name], male   DOB: 07-24-1966, 57 y.o.   MRN: [id number]         Chief Complaint:: wellness exam and Annual Exam (Concerns about feet issues and blood sugar )  , hld, depression, neuropathy, right foot pain       HPI:  William [name] is a 57 y.o. male here for wellness exam; for tdap and shingrix at pharmacy, o/w up to date                        Also Pt denies chest pain, increased sob or doe, wheezing, orthopnea, PND, increased LE swelling, palpitations, dizziness or syncope.   Pt denies polydipsia, polyuria, or new focal neuro s/s.    Pt denies fever, wt loss, night sweats, loss of appetite, or other constitutional symptoms    Gained wt in last 3 mo due to no walking. Plans to restart soon.  Did not tolerate ozempic 1 mg last oct 2023 , taking 0.5 mg only due to nausea and GI upset.  Wt overall better but still gaining.  Asks for nausea med.  Has had mild worsening depressive symptoms, but no suicidal ideation, or panic; has ongoing anxiety, some worsening recently with increased stressors and finanical strain.  Also has persistent painful worsening neuropathy, also has more specific right foot pain near the first mtp with bunion but describes the worse pain and numbness there as well.  Has been limited with 0.5 mg ozempic due to nausea and GI upset.   Wt Readings from Last 3 Encounters:  06/12/23 260 lb (117.9 kg)  04/19/23 247 lb (112 kg)  03/17/23 247 lb (112 kg)   BP Readings from Last 3 Encounters:  06/12/23 124/76  04/19/23 111/78  01/30/23 118/80   Immunization History  Administered Date(s) Administered   PNEUMOCOCCAL CONJUGATE-20 12/12/2022   Td 12/06/2003   Health Maintenance Due  Topic Date Due   DTaP/Tdap/Td (2 - Tdap) 12/05/2013   Zoster Vaccines- Shingrix (1 of 2) Never done      Past Medical History:  Diagnosis Date   Anxiety    Degenerative arthritis of lumbar spine 12/12/2013   Diabetes mellitus without complication (HCC)    on meds    Diverticulitis    Encounter for well adult exam with abnormal findings 07/12/2013   GERD (gastroesophageal reflux disease)    Hypertension    on meds   Sleep apnea    uses CPAP nightly   Past Surgical History:  Procedure Laterality Date   COLONOSCOPY  2018   JP-MAC-suprep (exc)-elongated redundant colon/int hem/tics/4 polyps   WISDOM TOOTH EXTRACTION      reports that he has never smoked. He has never used smokeless tobacco. He reports current alcohol use. He reports that he does not use drugs. family history includes Hyperlipidemia in his mother; Ovarian cancer (age of onset: 20) in his mother; Pancreatic cancer in his father. Allergies  Allergen Reactions   Acetaminophen     Other reaction(s): Other (See Comments) "made me feel misplaced"   Blue Dyes (Parenteral)     Other reaction(s): Other (See Comments) Pt states he is "just allergic to dyes" Unknown as to what dye that is.  "made my skin break out in hives"   Ivp Dye [Iodinated Contrast Media] Hives    Per pt after CT with IV contrast aug 2014   Metformin And Related Diarrhea   Niacin    No current outpatient medications on  file prior to visit.   No current facility-administered medications on file prior to visit.        ROS:  All others reviewed and negative.  Objective        PE:  BP 124/76 (BP Location: Right Arm, Patient Position: Sitting, Cuff Size: Normal)   Pulse 68   Temp 98.3 F (36.8 C) (Oral)   Ht 6\' 2"  (1.88 m)   Wt 260 lb (117.9 kg)   SpO2 97%   BMI 33.38 kg/m                 Constitutional: Pt appears in NAD               HENT: Head: NCAT.                Right Ear: External ear normal.                 Left Ear: External ear normal.                Eyes: . Pupils are equal, round, and reactive to light. Conjunctivae and EOM are normal               Nose: without d/c or deformity               Neck: Neck supple. Gross normal ROM               Cardiovascular: Normal rate and regular rhythm.                  Pulmonary/Chest: Effort normal and breath sounds without rales or wheezing.                Abd:  Soft, NT, ND, + BS, no organomegaly               Neurological: Pt is alert. At baseline orientation, motor grossly intact               Skin: Skin is warm. No rashes, no other new lesions, LE edema - none               Psychiatric: Pt behavior is normal without agitation   Micro: none  Cardiac tracings I have personally interpreted today:  none  Pertinent Radiological findings (summarize): none   Lab Results  Component Value Date   WBC 8.8 06/09/2023   HGB 17.4 (H) 06/09/2023   HCT 52.2 (H) 06/09/2023   PLT 185.0 06/09/2023   GLUCOSE 107 (H) 06/09/2023   CHOL 192 06/09/2023   TRIG (H) 06/09/2023    400.0 Triglyceride is over 400; calculations on Lipids are invalid.   HDL 33.70 (L) 06/09/2023   LDLDIRECT 73.0 06/09/2023   LDLCALC 107 (H) 03/27/2020   ALT 25 06/09/2023   AST 23 06/09/2023   NA 137 06/09/2023   K 4.4 06/09/2023   CL 99 06/09/2023   CREATININE 1.01 06/09/2023   BUN 11 06/09/2023   CO2 27 06/09/2023   TSH 3.02 06/09/2023   PSA 0.46 06/09/2023   HGBA1C 6.3 06/09/2023   MICROALBUR <0.7 06/09/2023   Assessment/Plan:  William [name] is a 57 y.o. White or Caucasian [1] male with  has a past medical history of Anxiety, Degenerative arthritis of lumbar spine (12/12/2013), Diabetes mellitus without complication (HCC), Diverticulitis, Encounter for well adult exam with abnormal findings (07/12/2013), GERD (gastroesophageal reflux disease), Hypertension, and Sleep apnea.  Encounter for well adult exam with abnormal findings Age and  sex appropriate education and counseling updated with regular exercise and diet Referrals for preventative services - none needed Immunizations addressed - for tdap and shingrix at pharmacy Smoking counseling  - none needed Evidence for depression or other mood disorder - none significant Most recent labs reviewed. I have  personally reviewed and have noted: 1) the patient's medical and social history 2) The patient's current medications and supplements 3) The patient's height, weight, and BMI have been recorded in the chart   Depression, recurrent (HCC) Mild to mod, has not tolerated ssri due to sexual and other side effect, ok for cymbalta 30 every day, with increase to 60 at 1 mo if tolerates ok,  to f/u any worsening symptoms or concerns  Diabetes mellitus with diabetic neuropathy (HCC) Lab Results  Component Value Date   HGBA1C 6.3 06/09/2023   Good control, but limited to 0.5 mg ozempic, unable to lose wt, to restart walking for exercise, also change ozempic to mounjaro 2.5 mg with icnrease to 5 mg if tolerating ok at 1 mo, , and continue farxiga `10 qd   Essential hypertension BP Readings from Last 3 Encounters:  06/12/23 124/76  04/19/23 111/78  01/30/23 118/80   Stable, pt to continue medical treatment toprol xl 50 qd   Hyperlipidemia Lab Results  Component Value Date   CHOL 192 06/09/2023   HDL 33.70 (L) 06/09/2023   LDLCALC 107 (H) 03/27/2020   LDLDIRECT 73.0 06/09/2023   TRIG (H) 06/09/2023    400.0 Triglyceride is over 400; calculations on Lipids are invalid.   CHOLHDL 6 06/09/2023    Uncontrolled, to start fenofirbrate 145 mg qd   Vitamin D deficiency Last vitamin D Lab Results  Component Value Date   VD25OH 20.67 (L) 06/09/2023   Low, to start oral replacement   Right foot pain Not clear if related to neuropathy - also for podiatry referral, start cymbalta 30 mg per day  Followup: Return in about 6 months (around 12/13/2023).  Oliver Barre, MD 06/14/2023 1:35 PM [facility]

## 2023-06-12 NOTE — Assessment & Plan Note (Signed)
Lab Results  Component Value Date   HGBA1C 6.3 06/09/2023   Good control, but limited to 0.5 mg ozempic, unable to lose wt, to restart walking for exercise, also change ozempic to mounjaro 2.5 mg with icnrease to 5 mg if tolerating ok at 1 mo, , and continue farxiga `10 qd

## 2023-06-12 NOTE — Assessment & Plan Note (Signed)
Mild to mod, has not tolerated ssri due to sexual and other side effect, ok for cymbalta 30 every day, with increase to 60 at 1 mo if tolerates ok,  to f/u any worsening symptoms or concerns

## 2023-06-12 NOTE — Assessment & Plan Note (Signed)
Lab Results  Component Value Date   CHOL 192 06/09/2023   HDL 33.70 (L) 06/09/2023   LDLCALC 107 (H) 03/27/2020   LDLDIRECT 73.0 06/09/2023   TRIG (H) 06/09/2023    400.0 Triglyceride is over 400; calculations on Lipids are invalid.   CHOLHDL 6 06/09/2023    Uncontrolled, to start fenofirbrate 145 mg qd

## 2023-06-12 NOTE — Assessment & Plan Note (Signed)
BP Readings from Last 3 Encounters:  06/12/23 124/76  04/19/23 111/78  01/30/23 118/80   Stable, pt to continue medical treatment toprol xl 50 qd

## 2023-06-12 NOTE — Assessment & Plan Note (Signed)
Not clear if related to neuropathy - also for podiatry referral, start cymbalta 30 mg per day

## 2023-06-12 NOTE — Assessment & Plan Note (Signed)
Last vitamin D Lab Results  Component Value Date   VD25OH 20.67 (L) 06/09/2023   Low, to start oral replacement

## 2023-06-12 NOTE — Assessment & Plan Note (Signed)
Age and sex appropriate education and counseling updated with regular exercise and diet Referrals for preventative services - none needed Immunizations addressed - for tdap and shingrix at pharmacy Smoking counseling  - none needed Evidence for depression or other mood disorder - none significant Most recent labs reviewed. I have personally reviewed and have noted: 1) the patient's medical and social history 2) The patient's current medications and supplements 3) The patient's height, weight, and BMI have been recorded in the chart  

## 2023-06-12 NOTE — Patient Instructions (Addendum)
Please take all new medication as prescribed  - the fenofibrate 145 mg for the triglycerides and LDL cholesterol  Ok to change the ozempic to the mounjaro 2.5 mg - and call or mychart message in 1 mo if you are taking this ok for an increase to the 5 mg  Please take all new medication as prescribed - the cymbalta 30 mg for nerve pain and arthritis pain (and depression anxiety) - and call or mychart message for the 60 mg if you are taking this ok  Please continue all other medications as before, and refills have been done if requested.  Please have the pharmacy call with any other refills you may need.  Please continue your efforts at being more active, low cholesterol diet, and weight control.  You are otherwise up to date with prevention measures today.  Please keep your appointments with your specialists as you may have planned  You will be contacted regarding the referral for: poditry  Your lab work was done  Please make an Appointment to return in 6 months, or sooner if needed, also with Lab Appointment for testing done 3-5 days before at the FIRST FLOOR Lab (so this is for TWO appointments - please see the scheduling desk as you leave)

## 2023-06-13 ENCOUNTER — Encounter: Payer: Self-pay | Admitting: Internal Medicine

## 2023-06-14 MED ORDER — FENOFIBRATE 145 MG PO TABS: 145.0000 mg | ORAL_TABLET | Freq: Every day | ORAL | 3 refills | Status: DC

## 2023-06-14 MED ORDER — SEMAGLUTIDE (1 MG/DOSE) 4 MG/3ML ~~LOC~~ SOPN: 1.0000 mg | PEN_INJECTOR | SUBCUTANEOUS | 3 refills | Status: DC

## 2023-06-14 MED ORDER — DULOXETINE HCL 30 MG PO CPEP
30.0000 mg | ORAL_CAPSULE | Freq: Every day | ORAL | 3 refills | Status: DC
Start: 2023-06-14 — End: 2023-12-11

## 2023-06-14 NOTE — Addendum Note (Signed)
Addended by: Corwin Levins on: 06/14/2023 01:34 PM   Modules accepted: Orders

## 2023-06-21 ENCOUNTER — Ambulatory Visit (INDEPENDENT_AMBULATORY_CARE_PROVIDER_SITE_OTHER): Payer: Managed Care, Other (non HMO)

## 2023-06-21 ENCOUNTER — Ambulatory Visit: Payer: Managed Care, Other (non HMO) | Admitting: Internal Medicine

## 2023-06-21 VITALS — BP 144/108 | HR 116 | Temp 98.3°F | Ht 74.0 in | Wt 243.0 lb

## 2023-06-21 DIAGNOSIS — K30 Functional dyspepsia: Secondary | ICD-10-CM | POA: Diagnosis not present

## 2023-06-21 DIAGNOSIS — R197 Diarrhea, unspecified: Secondary | ICD-10-CM

## 2023-06-21 DIAGNOSIS — E7800 Pure hypercholesterolemia, unspecified: Secondary | ICD-10-CM | POA: Diagnosis not present

## 2023-06-21 DIAGNOSIS — E559 Vitamin D deficiency, unspecified: Secondary | ICD-10-CM

## 2023-06-21 DIAGNOSIS — R110 Nausea: Secondary | ICD-10-CM | POA: Diagnosis not present

## 2023-06-21 DIAGNOSIS — E1140 Type 2 diabetes mellitus with diabetic neuropathy, unspecified: Secondary | ICD-10-CM

## 2023-06-21 MED ORDER — PREGABALIN 25 MG PO CAPS: 25.0000 mg | ORAL_CAPSULE | Freq: Three times a day (TID) | ORAL | 3 refills | Status: DC

## 2023-06-21 MED ORDER — PROMETHAZINE HCL 25 MG PO TABS: 25.0000 mg | ORAL_TABLET | Freq: Three times a day (TID) | ORAL | 1 refills | Status: DC | PRN

## 2023-06-21 MED ORDER — TIRZEPATIDE 2.5 MG/0.5ML ~~LOC~~ SOAJ
2.5000 mg | SUBCUTANEOUS | 11 refills | Status: DC
Start: 2023-06-21 — End: 2023-06-21

## 2023-06-21 MED ORDER — TIRZEPATIDE 2.5 MG/0.5ML ~~LOC~~ SOAJ: 2.5000 mg | SUBCUTANEOUS | 11 refills | Status: DC

## 2023-06-21 NOTE — Progress Notes (Signed)
Patient ID: William [name], male   DOB: [birth year], 58 y.o.   MRN: [id number]        Chief Complaint: follow up recent nausea and diarrhea       HPI:  William [name] is a 57 y.o. male here with c/o sudden onset nausea and diarrhea stools very soon after starting ozempic and cymbalta, without fever, pain, bloating, or vomiting.  Pt denies chest pain, increased sob or doe, wheezing, orthopnea, PND, increased LE swelling, palpitations, dizziness or syncope.  Pt denies new neurological symptoms such as new headache, or facial or extremity weakness or numbness   Pt denies fever, wt loss, night sweats, loss of appetite, or other constitutional symptoms  Has lost several lbs with recent distress.         Wt Readings from Last 3 Encounters:  06/21/23 243 lb (110.2 kg)  06/12/23 260 lb (117.9 kg)  04/19/23 247 lb (112 kg)   BP Readings from Last 3 Encounters:  06/21/23 (!) 144/108  06/12/23 124/76  04/19/23 111/78         Past Medical History:  Diagnosis Date   Anxiety    Degenerative arthritis of lumbar spine 12/12/2013   Diabetes mellitus without complication (HCC)    on meds   Diverticulitis    Encounter for well adult exam with abnormal findings 07/12/2013   GERD (gastroesophageal reflux disease)    Hypertension    on meds   Sleep apnea    uses CPAP nightly   Past Surgical History:  Procedure Laterality Date   COLONOSCOPY  2018   JP-MAC-suprep (exc)-elongated redundant colon/int hem/tics/4 polyps   WISDOM TOOTH EXTRACTION      reports that he has never smoked. He has never used smokeless tobacco. He reports current alcohol use. He reports that he does not use drugs. family history includes Hyperlipidemia in his mother; Ovarian cancer (age of onset: 67) in his mother; Pancreatic cancer in his father. Allergies  Allergen Reactions   Acetaminophen     Other reaction(s): Other (See Comments) "made me feel misplaced"   Blue Dyes (Parenteral)     Other reaction(s): Other (See  Comments) Pt states he is "just allergic to dyes" Unknown as to what dye that is.  "made my skin break out in hives"   Ivp Dye [Iodinated Contrast Media] Hives    Per pt after CT with IV contrast aug 2014   Metformin And Related Diarrhea   Niacin    Current Outpatient Medications on File Prior to Visit  Medication Sig Dispense Refill   dapagliflozin propanediol (FARXIGA) 10 MG TABS tablet Take 1 tablet (10 mg total) by mouth daily before breakfast. 90 tablet 3   DULoxetine (CYMBALTA) 30 MG capsule Take 1 capsule (30 mg total) by mouth daily. 90 capsule 3   fenofibrate (TRICOR) 145 MG tablet Take 1 tablet (145 mg total) by mouth daily. 90 tablet 3   metoprolol succinate (TOPROL-XL) 50 MG 24 hr tablet Take 1 tablet (50 mg total) by mouth daily. Take with or immediately following a meal. 90 tablet 3   ondansetron (ZOFRAN-ODT) 4 MG disintegrating tablet Take 1 tablet (4 mg total) by mouth every 8 (eight) hours as needed for nausea or vomiting. 25 tablet 1   No current facility-administered medications on file prior to visit.        ROS:  All others reviewed and negative.  Objective        PE:  BP (!) 144/108 (BP Location: Left Arm,  Patient Position: Sitting, Cuff Size: Normal)   Pulse (!) 116   Temp 98.3 F (36.8 C) (Oral)   Ht 6\' 2"  (1.88 m)   Wt 243 lb (110.2 kg)   SpO2 97%   BMI 31.20 kg/m                 Constitutional: Pt appears in NAD               HENT: Head: NCAT.                Right Ear: External ear normal.                 Left Ear: External ear normal.                Eyes: . Pupils are equal, round, and reactive to light. Conjunctivae and EOM are normal               Nose: without d/c or deformity               Neck: Neck supple. Gross normal ROM               Cardiovascular: Normal rate and regular rhythm.                 Pulmonary/Chest: Effort normal and breath sounds without rales or wheezing.                Abd:  Soft, NT, ND, + BS, no organomegaly                Neurological: Pt is alert. At baseline orientation, motor grossly intact               Skin: Skin is warm. No rashes, no other new lesions, LE edema - none               Psychiatric: Pt behavior is normal without agitation   Micro: none  Cardiac tracings I have personally interpreted today:  none  Pertinent Radiological findings (summarize): none   Lab Results  Component Value Date   WBC 9.2 06/22/2023   HGB 18.6 Repeated and verified X2. (HH) 06/22/2023   HCT 54.7 Repeated and verified X2. (H) 06/22/2023   PLT 206.0 06/22/2023   GLUCOSE 174 (H) 06/22/2023   CHOL 167 06/22/2023   TRIG 155.0 (H) 06/22/2023   HDL 31.10 (L) 06/22/2023   LDLDIRECT 73.0 06/09/2023   LDLCALC 105 (H) 06/22/2023   ALT 27 06/22/2023   AST 31 06/22/2023   NA 133 (L) 06/22/2023   K 4.2 06/22/2023   CL 96 06/22/2023   CREATININE 1.18 06/22/2023   BUN 18 06/22/2023   CO2 26 06/22/2023   TSH 3.02 06/09/2023   PSA 0.46 06/09/2023   HGBA1C 6.2 06/22/2023   MICROALBUR <0.7 06/09/2023   Assessment/Plan:  William [name] is a 57 y.o. White or Caucasian [1] male with  has a past medical history of Anxiety, Degenerative arthritis of lumbar spine (12/12/2013), Diabetes mellitus without complication (HCC), Diverticulitis, Encounter for well adult exam with abnormal findings (07/12/2013), GERD (gastroesophageal reflux disease), Hypertension, and Sleep apnea.  Gastrointestinal distress With primarily nausea and diarrhea apparently onset with taking ozempic and cymblata, without fever or pain or blood - etiology unclear but may be med related, I suspect more likely the ozempic, but he believes cymbalta after reading the side effects; now has stopped and does not want to restart, exam benign now except for recent  wt loss, for anti-emetic prn, and  to f/u any worsening symptoms or concerns, consider further GI referral for recurrent or worsening symptoms. , today for f/u plain film and labs eval today including cbc    Diabetes mellitus with diabetic neuropathy (HCC) With uncontrolled pain, for lyrica trail low dose,  to f/u any worsening symptoms or concerns, also later start mounjaro 2.5 mg weekly for sugar and wt loss  Vitamin D deficiency Last vitamin D Lab Results  Component Value Date   VD25OH 24.38 (L) 06/22/2023   Low, to start oral replacement   Hyperlipidemia Lab Results  Component Value Date   LDLCALC 105 (H) 06/22/2023   Uncontrolled, goal ldl < 70, pt to continue current statin tricor 145 every day, declines statin for now  Followup: Return if symptoms worsen or fail to improve.  Oliver Barre, MD 06/24/2023 6:59 AM [facility]  Primary Care - [facility]

## 2023-06-21 NOTE — Patient Instructions (Signed)
Ok to stop the ozempic and cymbalta as you have  Please take all new medication as prescribed - the phenergan as needed for nausea (watch for sleepiness)  Please take all new medication as prescribed - the mounjaro 2.5 mg after a few more days when we expect your current symptoms to improve  Please call in 1 month if you are taking well, for the higher mounjaro dose 5 mg   Ok to try to take the Lyrica for nerve pain as you feel comfortable with trying if you take the mounjaro ok  Please continue all other medications as before, and refills have been done if requested.  Please have the pharmacy call with any other refills you may need.  Please keep your appointments with your specialists as you may have planned  Please go to the XRAY Department in the first floor for the x-ray testing  Please go to the LAB at the blood drawing area for the tests to be done  You will be contacted by phone if any changes need to be made immediately.  Otherwise, you will receive a letter about your results with an explanation, but please check with MyChart first.

## 2023-06-22 ENCOUNTER — Other Ambulatory Visit: Payer: Managed Care, Other (non HMO)

## 2023-06-22 ENCOUNTER — Telehealth: Payer: Self-pay

## 2023-06-22 ENCOUNTER — Other Ambulatory Visit: Payer: Self-pay | Admitting: Internal Medicine

## 2023-06-22 DIAGNOSIS — R110 Nausea: Secondary | ICD-10-CM | POA: Diagnosis not present

## 2023-06-22 DIAGNOSIS — E1140 Type 2 diabetes mellitus with diabetic neuropathy, unspecified: Secondary | ICD-10-CM | POA: Diagnosis not present

## 2023-06-22 DIAGNOSIS — D751 Secondary polycythemia: Secondary | ICD-10-CM

## 2023-06-22 DIAGNOSIS — R197 Diarrhea, unspecified: Secondary | ICD-10-CM | POA: Diagnosis not present

## 2023-06-22 DIAGNOSIS — E559 Vitamin D deficiency, unspecified: Secondary | ICD-10-CM | POA: Diagnosis not present

## 2023-06-22 DIAGNOSIS — E871 Hypo-osmolality and hyponatremia: Secondary | ICD-10-CM

## 2023-06-22 LAB — CBC WITH DIFFERENTIAL/PLATELET
Basophils Absolute: 0.1 10*3/uL (ref 0.0–0.1)
Basophils Relative: 1.3 % (ref 0.0–3.0)
Eosinophils Absolute: 0.2 10*3/uL (ref 0.0–0.7)
Eosinophils Relative: 2.1 % (ref 0.0–5.0)
HCT: 54.7 % — ABNORMAL HIGH (ref 39.0–52.0)
Hemoglobin: 18.6 g/dL (ref 13.0–17.0)
Lymphocytes Relative: 38.6 % (ref 12.0–46.0)
Lymphs Abs: 3.5 10*3/uL (ref 0.7–4.0)
MCHC: 34 g/dL (ref 30.0–36.0)
MCV: 86.9 fl (ref 78.0–100.0)
Monocytes Absolute: 0.9 10*3/uL (ref 0.1–1.0)
Monocytes Relative: 9.5 % (ref 3.0–12.0)
Neutro Abs: 4.5 10*3/uL (ref 1.4–7.7)
Neutrophils Relative %: 48.5 % (ref 43.0–77.0)
Platelets: 206 10*3/uL (ref 150.0–400.0)
RBC: 6.29 Mil/uL — ABNORMAL HIGH (ref 4.22–5.81)
RDW: 13.8 % (ref 11.5–15.5)
WBC: 9.2 10*3/uL (ref 4.0–10.5)

## 2023-06-22 LAB — HEPATIC FUNCTION PANEL
ALT: 27 U/L (ref 0–53)
AST: 31 U/L (ref 0–37)
Albumin: 4.8 g/dL (ref 3.5–5.2)
Alkaline Phosphatase: 64 U/L (ref 39–117)
Bilirubin, Direct: 0.3 mg/dL (ref 0.0–0.3)
Total Bilirubin: 1.6 mg/dL — ABNORMAL HIGH (ref 0.2–1.2)
Total Protein: 8 g/dL (ref 6.0–8.3)

## 2023-06-22 LAB — VITAMIN D 25 HYDROXY (VIT D DEFICIENCY, FRACTURES): VITD: 24.38 ng/mL — ABNORMAL LOW (ref 30.00–100.00)

## 2023-06-22 LAB — LIPID PANEL
Cholesterol: 167 mg/dL (ref 0–200)
HDL: 31.1 mg/dL — ABNORMAL LOW (ref >39.00)
LDL Cholesterol: 105 mg/dL — ABNORMAL HIGH (ref 0–99)
NonHDL: 135.73
Total CHOL/HDL Ratio: 5
Triglycerides: 155 mg/dL — ABNORMAL HIGH (ref 0.0–149.0)
VLDL: 31 mg/dL (ref 0.0–40.0)

## 2023-06-22 LAB — HEMOGLOBIN A1C: Hgb A1c MFr Bld: 6.2 % (ref 4.6–6.5)

## 2023-06-22 LAB — BASIC METABOLIC PANEL
BUN: 18 mg/dL (ref 6–23)
CO2: 26 mEq/L (ref 19–32)
Calcium: 10.2 mg/dL (ref 8.4–10.5)
Chloride: 96 mEq/L (ref 96–112)
Creatinine, Ser: 1.18 mg/dL (ref 0.40–1.50)
GFR: 67.53 mL/min (ref >60.00)
Glucose, Bld: 174 mg/dL — ABNORMAL HIGH (ref 70–99)
Potassium: 4.2 mEq/L (ref 3.5–5.1)
Sodium: 133 mEq/L — ABNORMAL LOW (ref 135–145)

## 2023-06-24 ENCOUNTER — Encounter: Payer: Self-pay | Admitting: Internal Medicine

## 2023-06-24 DIAGNOSIS — K30 Functional dyspepsia: Secondary | ICD-10-CM | POA: Insufficient documentation

## 2023-06-24 NOTE — Assessment & Plan Note (Signed)
Last vitamin D Lab Results  Component Value Date   VD25OH 24.38 (L) 06/22/2023   Low, to start oral replacement

## 2023-06-24 NOTE — Assessment & Plan Note (Signed)
Lab Results  Component Value Date   LDLCALC 105 (H) 06/22/2023   Uncontrolled, goal ldl < 70, pt to continue current statin tricor 145 every day, declines statin for now

## 2023-06-24 NOTE — Assessment & Plan Note (Addendum)
With primarily nausea and diarrhea apparently onset with taking ozempic and cymblata, without fever or pain or blood - etiology unclear but may be med related, I suspect more likely the ozempic, but he believes cymbalta after reading the side effects; now has stopped and does not want to restart, exam benign now except for recent wt loss, for anti-emetic prn, and  to f/u any worsening symptoms or concerns, consider further GI referral for recurrent or worsening symptoms. , today for f/u plain film and labs eval today including cbc

## 2023-06-24 NOTE — Assessment & Plan Note (Addendum)
With uncontrolled pain, for lyrica trail low dose,  to f/u any worsening symptoms or concerns, also later start mounjaro 2.5 mg weekly for sugar and wt loss

## 2023-07-03 ENCOUNTER — Ambulatory Visit: Payer: Managed Care, Other (non HMO) | Admitting: Podiatry

## 2023-07-03 ENCOUNTER — Encounter: Payer: Self-pay | Admitting: Podiatry

## 2023-07-03 VITALS — BP 139/92 | HR 70

## 2023-07-03 DIAGNOSIS — E1142 Type 2 diabetes mellitus with diabetic polyneuropathy: Secondary | ICD-10-CM | POA: Diagnosis not present

## 2023-07-03 NOTE — Progress Notes (Signed)
  Subjective:  Patient ID: William [name], male    DOB: 03-14-1966,  MRN: [id number]  Chief Complaint  Patient presents with   Diabetes    "My GP referred me.  I have Diabetic Neuropathy." N - Neuropathy L - 1-4 bilateral D - over 1 yr O - suddenly, about the same C - numbness A - none T - none   Nail Problem    "I think I have an ingrown toenail." N - ingrown toenail L - 1 month D - hallux right O - off and on C - sore A - none T - clean it    57 y.o. male presents with the above complaint. History confirmed with patient.  His diabetes is well-controlled, most what he feels occasional tingling and numbness and itching type sensations, he has quite a bit of sensitivity on his feet especially if he goes barefoot which is intolerable  Objective:  Physical Exam: warm, good capillary refill, no trophic changes or ulcerative lesions, normal DP and PT pulses, and abnormal sensory exam with loss of protective sensation to monofilament which is inconsistent in the tips of the toes and sulcus of the foot..  Assessment:   1. Diabetic peripheral neuropathy (HCC)      Plan:  Patient was evaluated and treated and all questions answered.  Ingrown nail only minorly bothersome.  Discussed permanent removal of this if it becomes problematic  Patient educated on diabetes. Discussed proper diabetic foot care and discussed risks and complications of disease. Educated patient in depth on reasons to return to the office immediately should he/she discover anything concerning or new on the feet. All questions answered. Discussed proper shoes as well.  We discussed his diabetic peripheral neuropathy.  His A1c is well-controlled.  His PCP trialed Cymbalta but this made him quite sick.  Currently most of his symptoms appear to be numbness related and occasional discomfort but this does not seem to be lifestyle or limiting from a functional standpoint.  We discussed the importance of daily foot  inspection, never going barefoot and wearing shoes.  He will let me know if this worsens if he would like to proceed with other medication such as Lyrica or gabapentin, would prefer to start him on gabapentin to start.  Follow-up in 1 year for annual diabetic foot exam or sooner if issues.   Return in about 1 year (around 07/02/2024) for diabetic foot exam.

## 2023-07-20 MED ORDER — TIRZEPATIDE 5 MG/0.5ML ~~LOC~~ SOAJ: 5.0000 mg | SUBCUTANEOUS | 3 refills | Status: DC

## 2023-07-20 NOTE — Addendum Note (Signed)
Addended by: Corwin Levins on: 07/20/2023 05:36 PM   Modules accepted: Orders

## 2023-09-08 ENCOUNTER — Encounter: Payer: Self-pay | Admitting: Internal Medicine

## 2023-11-07 ENCOUNTER — Encounter: Payer: Self-pay | Admitting: Internal Medicine

## 2023-11-07 DIAGNOSIS — G4733 Obstructive sleep apnea (adult) (pediatric): Secondary | ICD-10-CM

## 2023-11-07 DIAGNOSIS — F411 Generalized anxiety disorder: Secondary | ICD-10-CM

## 2023-12-08 ENCOUNTER — Other Ambulatory Visit (INDEPENDENT_AMBULATORY_CARE_PROVIDER_SITE_OTHER): Payer: Managed Care, Other (non HMO)

## 2023-12-08 DIAGNOSIS — E871 Hypo-osmolality and hyponatremia: Secondary | ICD-10-CM

## 2023-12-08 DIAGNOSIS — D751 Secondary polycythemia: Secondary | ICD-10-CM | POA: Diagnosis not present

## 2023-12-08 LAB — BASIC METABOLIC PANEL
BUN: 13 mg/dL (ref 6–23)
CO2: 28 meq/L (ref 19–32)
Calcium: 9.4 mg/dL (ref 8.4–10.5)
Chloride: 100 meq/L (ref 96–112)
Creatinine, Ser: 0.97 mg/dL (ref 0.40–1.50)
GFR: 85.16 mL/min (ref >60.00)
Glucose, Bld: 137 mg/dL — ABNORMAL HIGH (ref 70–99)
Potassium: 4 meq/L (ref 3.5–5.1)
Sodium: 137 meq/L (ref 135–145)

## 2023-12-08 LAB — CBC WITH DIFFERENTIAL/PLATELET
Basophils Absolute: 0.1 10*3/uL (ref 0.0–0.1)
Basophils Relative: 1.1 % (ref 0.0–3.0)
Eosinophils Absolute: 0.2 10*3/uL (ref 0.0–0.7)
Eosinophils Relative: 2.8 % (ref 0.0–5.0)
HCT: 49.3 % (ref 39.0–52.0)
Hemoglobin: 16.7 g/dL (ref 13.0–17.0)
Lymphocytes Relative: 35.4 % (ref 12.0–46.0)
Lymphs Abs: 2.8 10*3/uL (ref 0.7–4.0)
MCHC: 33.8 g/dL (ref 30.0–36.0)
MCV: 88.9 fL (ref 78.0–100.0)
Monocytes Absolute: 0.6 10*3/uL (ref 0.1–1.0)
Monocytes Relative: 7.9 % (ref 3.0–12.0)
Neutro Abs: 4.1 10*3/uL (ref 1.4–7.7)
Neutrophils Relative %: 52.8 % (ref 43.0–77.0)
Platelets: 166 10*3/uL (ref 150.0–400.0)
RBC: 5.55 Mil/uL (ref 4.22–5.81)
RDW: 13.2 % (ref 11.5–15.5)
WBC: 7.8 10*3/uL (ref 4.0–10.5)

## 2023-12-08 NOTE — Progress Notes (Signed)
 The test results show that your current treatment is OK, as the tests are stable.  Please continue the same plan.  There is no other need for change of treatment or further evaluation based on these results, at this time.  thanks

## 2023-12-11 ENCOUNTER — Encounter: Payer: Self-pay | Admitting: Internal Medicine

## 2023-12-11 ENCOUNTER — Ambulatory Visit: Payer: Managed Care, Other (non HMO) | Admitting: Internal Medicine

## 2023-12-11 VITALS — BP 124/78 | HR 66 | Temp 98.2°F | Ht 74.0 in | Wt 243.0 lb

## 2023-12-11 DIAGNOSIS — R040 Epistaxis: Secondary | ICD-10-CM

## 2023-12-11 DIAGNOSIS — E538 Deficiency of other specified B group vitamins: Secondary | ICD-10-CM

## 2023-12-11 DIAGNOSIS — F411 Generalized anxiety disorder: Secondary | ICD-10-CM

## 2023-12-11 DIAGNOSIS — Z7985 Long-term (current) use of injectable non-insulin antidiabetic drugs: Secondary | ICD-10-CM | POA: Diagnosis not present

## 2023-12-11 DIAGNOSIS — E1140 Type 2 diabetes mellitus with diabetic neuropathy, unspecified: Secondary | ICD-10-CM

## 2023-12-11 DIAGNOSIS — Z0001 Encounter for general adult medical examination with abnormal findings: Secondary | ICD-10-CM | POA: Diagnosis not present

## 2023-12-11 DIAGNOSIS — Z125 Encounter for screening for malignant neoplasm of prostate: Secondary | ICD-10-CM

## 2023-12-11 DIAGNOSIS — I10 Essential (primary) hypertension: Secondary | ICD-10-CM

## 2023-12-11 DIAGNOSIS — E1165 Type 2 diabetes mellitus with hyperglycemia: Secondary | ICD-10-CM

## 2023-12-11 DIAGNOSIS — E7800 Pure hypercholesterolemia, unspecified: Secondary | ICD-10-CM

## 2023-12-11 DIAGNOSIS — E559 Vitamin D deficiency, unspecified: Secondary | ICD-10-CM

## 2023-12-11 LAB — VITAMIN B12: Vitamin B-12: 345 pg/mL (ref 211–911)

## 2023-12-11 LAB — URINALYSIS, ROUTINE W REFLEX MICROSCOPIC
Bilirubin Urine: NEGATIVE
Hgb urine dipstick: NEGATIVE
Ketones, ur: NEGATIVE
Leukocytes,Ua: NEGATIVE
Nitrite: NEGATIVE
RBC / HPF: NONE SEEN (ref 0–?)
Specific Gravity, Urine: <=1.005 — AB (ref 1.000–1.030)
Total Protein, Urine: NEGATIVE
Urine Glucose: NEGATIVE
Urobilinogen, UA: 0.2 (ref 0.0–1.0)
WBC, UA: NONE SEEN (ref 0–?)
pH: 6 (ref 5.0–8.0)

## 2023-12-11 LAB — LIPID PANEL
Cholesterol: 163 mg/dL (ref 0–200)
HDL: 35.4 mg/dL — ABNORMAL LOW (ref >39.00)
LDL Cholesterol: 95 mg/dL (ref 0–99)
NonHDL: 127.63
Total CHOL/HDL Ratio: 5
Triglycerides: 163 mg/dL — ABNORMAL HIGH (ref 0.0–149.0)
VLDL: 32.6 mg/dL (ref 0.0–40.0)

## 2023-12-11 LAB — VITAMIN D 25 HYDROXY (VIT D DEFICIENCY, FRACTURES): VITD: 22.35 ng/mL — ABNORMAL LOW (ref 30.00–100.00)

## 2023-12-11 LAB — MICROALBUMIN / CREATININE URINE RATIO
Creatinine,U: 53.2 mg/dL
Microalb Creat Ratio: 1.3 mg/g (ref 0.0–30.0)
Microalb, Ur: <0.7 mg/dL (ref 0.0–1.9)

## 2023-12-11 LAB — HEPATIC FUNCTION PANEL
ALT: 23 U/L (ref 0–53)
AST: 20 U/L (ref 0–37)
Albumin: 4.6 g/dL (ref 3.5–5.2)
Alkaline Phosphatase: 68 U/L (ref 39–117)
Bilirubin, Direct: 0.1 mg/dL (ref 0.0–0.3)
Total Bilirubin: 0.6 mg/dL (ref 0.2–1.2)
Total Protein: 7.1 g/dL (ref 6.0–8.3)

## 2023-12-11 LAB — BASIC METABOLIC PANEL
BUN: 11 mg/dL (ref 6–23)
CO2: 29 meq/L (ref 19–32)
Calcium: 9.4 mg/dL (ref 8.4–10.5)
Chloride: 101 meq/L (ref 96–112)
Creatinine, Ser: 0.95 mg/dL (ref 0.40–1.50)
GFR: 87.31 mL/min (ref >60.00)
Glucose, Bld: 137 mg/dL — ABNORMAL HIGH (ref 70–99)
Potassium: 4.3 meq/L (ref 3.5–5.1)
Sodium: 138 meq/L (ref 135–145)

## 2023-12-11 LAB — PSA: PSA: 0.39 ng/mL (ref 0.10–4.00)

## 2023-12-11 LAB — HEMOGLOBIN A1C: Hgb A1c MFr Bld: 5.9 % (ref 4.6–6.5)

## 2023-12-11 LAB — TSH: TSH: 1.58 u[IU]/mL (ref 0.35–5.50)

## 2023-12-11 MED ORDER — GABAPENTIN 100 MG PO CAPS: 100.0000 mg | ORAL_CAPSULE | Freq: Three times a day (TID) | ORAL | 5 refills | Status: DC

## 2023-12-11 MED ORDER — EMPAGLIFLOZIN 10 MG PO TABS: 10.0000 mg | ORAL_TABLET | Freq: Every day | ORAL | 3 refills | Status: DC

## 2023-12-11 MED ORDER — ALPRAZOLAM 0.5 MG PO TABS: 0.5000 mg | ORAL_TABLET | Freq: Two times a day (BID) | ORAL | 1 refills | Status: DC | PRN

## 2023-12-11 NOTE — Assessment & Plan Note (Signed)
 Age and sex appropriate education and counseling updated with regular exercise and diet Referrals for preventative services - none needed Immunizations addressed - for tdap and shingrx at pharmacy Smoking counseling  - none needed Evidence for depression or other mood disorder - severe anxiety - for psychiatry referral Most recent labs reviewed. I have personally reviewed and have noted: 1) the patient's medical and social history 2) The patient's current medications and supplements 3) The patient's height, weight, and BMI have been recorded in the chart

## 2023-12-11 NOTE — Assessment & Plan Note (Signed)
 Likely due to cpap with dry mucosa - for saline, and refer ENT

## 2023-12-11 NOTE — Assessment & Plan Note (Signed)
 Lab Results  Component Value Date   HGBA1C 6.2 06/22/2023   Cbgs have been 150- 160 uncontrolled as he stopped the farxiga,, pt to continue current medical treatment mounjaro 5 mg but try jardiance 10 qd

## 2023-12-11 NOTE — Assessment & Plan Note (Signed)
 Lab Results  Component Value Date   LDLCALC 105 (H) 06/22/2023   Uncontrolled, pt for lower chol diet, consider statin and check f/u lab today

## 2023-12-11 NOTE — Progress Notes (Signed)
 Patient ID: William [name], male   DOB: 25-Jun-1966, 58 y.o.   MRN: [id number]         Chief Complaint:: wellness exam and anxiety, dm, low vit d, htn, hld       HPI:  William [name] is a 58 y.o. male here for wellness exam; for tdap and shingrix at pharmacy, o/w up to date                        Also has chronic nausea it seems exacerbated by several meds in the past including cymbalta , farxiga .  Wants to continue mounjaro  since this is working out well with minimal side effects it seems and has had good wt loss . Peak wt has been close to 300 in past.   He is open to trying jardiance . Has night sweats in last few months with anxiety and panic worse at night for some reason after lost job 6 mo ago.  Asks for psychiatry referral, has tried celexa  but did not like the detached effect.  Also has right nosebleed several times in the past 3 weeks with minor discomfort, using CPAP at night and gets dried out sometimes.  Still has lower back pain and neuritic pains but has not done well with cymbalta  or lyrica  due to perceived nausea.  Pt denies chest pain, increased sob or doe, wheezing, orthopnea, PND, increased LE swelling, palpitations, dizziness or syncope.   Pt denies polydipsia, polyuria, or new focal neuro s/s.    Wt Readings from Last 3 Encounters:  12/11/23 243 lb (110.2 kg)  06/21/23 243 lb (110.2 kg)  06/12/23 260 lb (117.9 kg)   BP Readings from Last 3 Encounters:  12/11/23 124/78  07/03/23 (!) 139/92  06/21/23 (!) 144/108   Immunization History  Administered Date(s) Administered   PNEUMOCOCCAL CONJUGATE-20 12/12/2022   Td 12/06/2003   Health Maintenance Due  Topic Date Due   DTaP/Tdap/Td (2 - Tdap) 12/05/2013   Zoster Vaccines- Shingrix (1 of 2) Never done      Past Medical History:  Diagnosis Date   Anxiety    Degenerative arthritis of lumbar spine 12/12/2013   Diabetes mellitus without complication (HCC)    on meds   Diverticulitis    Encounter for well adult exam with  abnormal findings 07/12/2013   GERD (gastroesophageal reflux disease)    Hypertension    on meds   Sleep apnea    uses CPAP nightly   Past Surgical History:  Procedure Laterality Date   COLONOSCOPY  2018   JP-MAC-suprep (exc)-elongated redundant colon/int hem/tics/4 polyps   WISDOM TOOTH EXTRACTION      reports that he has never smoked. He has never used smokeless tobacco. He reports current alcohol use. He reports that he does not use drugs. family history includes Hyperlipidemia in his mother; Ovarian cancer (age of onset: 14) in his mother; Pancreatic cancer in his father. Allergies  Allergen Reactions   Acetaminophen      Other reaction(s): Other (See Comments) made me feel misplaced   Blue Dyes (Parenteral)     Other reaction(s): Other (See Comments) Pt states he is just allergic to dyes Unknown as to what dye that is.  made my skin break out in hives   Cymbalta  [Duloxetine  Hcl] Nausea Only   Fenofibrate  Micronized Nausea And Vomiting   Ivp Dye [Iodinated Contrast Media] Hives    Per pt after CT with IV contrast aug 2014   Metformin  And Related Diarrhea  Niacin    Current Outpatient Medications on File Prior to Visit  Medication Sig Dispense Refill   metoprolol  succinate (TOPROL -XL) 50 MG 24 hr tablet Take 1 tablet (50 mg total) by mouth daily. Take with or immediately following a meal. 90 tablet 3   tirzepatide  (MOUNJARO ) 5 MG/0.5ML Pen Inject 5 mg into the skin once a week. 6 mL 3   No current facility-administered medications on file prior to visit.        ROS:  All others reviewed and negative.  Objective        PE:  BP 124/78 (BP Location: Right Arm, Patient Position: Sitting, Cuff Size: Normal)   Pulse 66   Temp 98.2 F (36.8 C) (Oral)   Ht 6' 2 (1.88 m)   Wt 243 lb (110.2 kg)   SpO2 99%   BMI 31.20 kg/m                 Constitutional: Pt appears in NAD               HENT: Head: NCAT.                Right Ear: External ear normal.                  Left Ear: External ear normal.                Eyes: . Pupils are equal, round, and reactive to light. Conjunctivae and EOM are normal               Nose: without d/c or deformity               Neck: Neck supple. Gross normal ROM               Cardiovascular: Normal rate and regular rhythm.                 Pulmonary/Chest: Effort normal and breath sounds without rales or wheezing.                Abd:  Soft, NT, ND, + BS, no organomegaly               Neurological: Pt is alert. At baseline orientation, motor grossly intact               Skin: Skin is warm. No rashes, no other new lesions, LE edema - none               Psychiatric: Pt behavior is normal without agitation   Micro: none  Cardiac tracings I have personally interpreted today:  none  Pertinent Radiological findings (summarize): none   Lab Results  Component Value Date   WBC 7.8 12/08/2023   HGB 16.7 12/08/2023   HCT 49.3 12/08/2023   PLT 166.0 12/08/2023   GLUCOSE 137 (H) 12/08/2023   CHOL 167 06/22/2023   TRIG 155.0 (H) 06/22/2023   HDL 31.10 (L) 06/22/2023   LDLDIRECT 73.0 06/09/2023   LDLCALC 105 (H) 06/22/2023   ALT 27 06/22/2023   AST 31 06/22/2023   NA 137 12/08/2023   K 4.0 12/08/2023   CL 100 12/08/2023   CREATININE 0.97 12/08/2023   BUN 13 12/08/2023   CO2 28 12/08/2023   TSH 3.02 06/09/2023   PSA 0.46 06/09/2023   HGBA1C 6.2 06/22/2023   MICROALBUR <0.7 06/09/2023   Assessment/Plan:  William [name] is a 58 y.o. White or Caucasian [1] male with  has a past medical history of Anxiety, Degenerative arthritis of lumbar spine (12/12/2013), Diabetes mellitus without complication (HCC), Diverticulitis, Encounter for well adult exam with abnormal findings (07/12/2013), GERD (gastroesophageal reflux disease), Hypertension, and Sleep apnea.  Encounter for well adult exam with abnormal findings Age and sex appropriate education and counseling updated with regular exercise and diet Referrals for preventative  services - none needed Immunizations addressed - for tdap and shingrx at pharmacy Smoking counseling  - none needed Evidence for depression or other mood disorder - severe anxiety - for psychiatry referral Most recent labs reviewed. I have personally reviewed and have noted: 1) the patient's medical and social history 2) The patient's current medications and supplements 3) The patient's height, weight, and BMI have been recorded in the chart   Hyperlipidemia Lab Results  Component Value Date   LDLCALC 105 (H) 06/22/2023   Uncontrolled, pt for lower chol diet, consider statin and check f/u lab today   Anxiety state Severe at times uncontrolled, for xanax  0.5 mg prn, declines ssri, ok for psychiatry referral  Essential hypertension BP Readings from Last 3 Encounters:  12/11/23 124/78  07/03/23 (!) 139/92  06/21/23 (!) 144/108   Stable, pt to continue medical treatment toprol  xl 50 qd'  Diabetes mellitus with diabetic neuropathy (HCC) Lab Results  Component Value Date   HGBA1C 6.2 06/22/2023   Cbgs have been 150- 160 uncontrolled as he stopped the farxiga ,, pt to continue current medical treatment mounjaro  5 mg but try jardiance  10 qd    Vitamin D  deficiency Last vitamin D  Lab Results  Component Value Date   VD25OH 24.38 (L) 06/22/2023   Low, to start oral replacement   Right-sided nosebleed Likely due to cpap with dry mucosa - for saline, and refer ENT  Followup: Return in about 6 months (around 06/09/2024).  Lynwood Rush, MD 12/11/2023 1:56 PM [facility]

## 2023-12-11 NOTE — Assessment & Plan Note (Signed)
 Severe at times uncontrolled, for xanax 0.5 mg prn, declines ssri, ok for psychiatry referral

## 2023-12-11 NOTE — Progress Notes (Signed)
 The test results show that your current treatment is OK, as the tests are stable.  Please continue the same plan.  There is no other need for change of treatment or further evaluation based on these results, at this time.  thanks

## 2023-12-11 NOTE — Patient Instructions (Signed)
 Please take all new medication as prescribed  - the jardiance  10 mg per day, the xanax  as needed, the gabapentin  100 mg three times per day  Please call for higher dose gabapentin  in 1 month if you need increased and you are tolerating well  Please continue all other medications as before, and refills have been done if requested.  Please have the pharmacy call with any other refills you may need.  Please continue your efforts at being more active, low cholesterol diet, and weight control.  You are otherwise up to date with prevention measures today.  Please keep your appointments with your specialists as you may have planned  You will be contacted regarding the referral for: ENT  Please go to the LAB at the blood drawing area for the tests to be done  You will be contacted by phone if any changes need to be made immediately.  Otherwise, you will receive a letter about your results with an explanation, but please check with MyChart first.  Please make an Appointment to return in 6 months, or sooner if needed, also with Lab Appointment for testing done 3-5 days before at the FIRST FLOOR Lab (so this is for TWO appointments - please see the scheduling desk as you leave)

## 2023-12-11 NOTE — Assessment & Plan Note (Signed)
 BP Readings from Last 3 Encounters:  12/11/23 124/78  07/03/23 (!) 139/92  06/21/23 (!) 144/108   Stable, pt to continue medical treatment toprol xl 50 qd'

## 2023-12-11 NOTE — Assessment & Plan Note (Signed)
Last vitamin D Lab Results  Component Value Date   VD25OH 24.38 (L) 06/22/2023   Low, to start oral replacement

## 2023-12-13 ENCOUNTER — Encounter (INDEPENDENT_AMBULATORY_CARE_PROVIDER_SITE_OTHER): Payer: Self-pay | Admitting: Otolaryngology

## 2023-12-27 NOTE — Progress Notes (Signed)
12/28/23- 58 yoM for sleep evaluation courtesy of Dr Jonny Ruiz with concern of OSA, complicated by HTN, DM2/neuropathy, GERD, Depression -Xanax 0.5, Neurontin 10, Mounjaro, NPSG 05/12/14- AHI 25/hr, desat to 86%, body weight 290 lbs Last saw Dr Shelle Iron 2015 CPAP auto 8-20/ [facility]    2015 Epworth score-1 Body weight today- 242 lbs Has lost 70 lbs in last 2 years deliberately. Has AirSense 10 CPAP and also a travel machine. Difficulty initiating sleep. Discussed the use of AI scribe software for clinical note transcription with the patient, who gave verbal consent to proceed.  History of Present Illness   The patient, a long-term user of CPAP therapy for approximately twenty-five years, presents with concerns about his current CPAP machines. He has been using a travel machine almost exclusively for the past two years and has noticed that it is noisier than his main machine, a [employer] ten. The patient has not used the humidification feature on his main machine for about fifteen years due to discomfort. He reports that his prescribed pressure is nine and that his wife has noticed no snoring unless the mask comes off.  Over the past year and a half, the patient has lost a significant amount of weight (approximately seventy pounds) due to a diagnosis of diabetes. He has managed to bring his A1c down to 5.9 through changes in diet and medication. The patient is curious about whether he needs to adjust the pressure on his CPAP machine due to his weight loss. He also expresses a need for a new CPAP machine and is unsure about how to obtain one.  The patient also reports some irritation from the mask he uses with his CPAP machine. He describes a tickling sensation in his nose and has been using olive oil or lip balm to alleviate the dryness. He expresses interest in exploring other mask options that might be more comfortable.   Prior to Admission medications   Medication Sig Start Date End Date Taking?  Authorizing Provider  ALPRAZolam Prudy Feeler) 0.5 MG tablet Take 1 tablet (0.5 mg total) by mouth 2 (two) times daily as needed for anxiety. 12/11/23  Yes Corwin Levins, MD  empagliflozin (JARDIANCE) 10 MG TABS tablet Take 1 tablet (10 mg total) by mouth daily before breakfast. 12/11/23  Yes Corwin Levins, MD  gabapentin (NEURONTIN) 100 MG capsule Take 1 capsule (100 mg total) by mouth 3 (three) times daily. 12/11/23  Yes Corwin Levins, MD  tirzepatide [facility]) 5 MG/0.5ML Pen Inject 5 mg into the skin once a week. 07/20/23  Yes Corwin Levins, MD  metoprolol succinate (TOPROL-XL) 50 MG 24 hr tablet Take 1 tablet (50 mg total) by mouth daily. Take with or immediately following a meal. Patient not taking: Reported on 12/28/2023 06/12/23   Corwin Levins, MD   Past Medical History:  Diagnosis Date   Anxiety    Degenerative arthritis of lumbar spine 12/12/2013   Diabetes mellitus without complication (HCC)    on meds   Diverticulitis    Encounter for well adult exam with abnormal findings 07/12/2013   GERD (gastroesophageal reflux disease)    Hypertension    on meds   Sleep apnea    uses CPAP nightly   Past Surgical History:  Procedure Laterality Date   COLONOSCOPY  2018   JP-MAC-suprep (exc)-elongated redundant colon/int hem/tics/4 polyps   WISDOM TOOTH EXTRACTION     .fmh Social History   Socioeconomic History   Marital status: Married    Spouse  name: Not on file   Number of children: Not on file   Years of education: Not on file   Highest education level: Not on file  Occupational History   Occupation: Retail banker    Employer: [employer]  Tobacco Use   Smoking status: Never   Smokeless tobacco: Never  Substance and Sexual Activity   Alcohol use: Yes    Alcohol/week: 0.0 - 1.0 standard drinks of alcohol    Comment: one drink per week, occ   Drug use: No   Sexual activity: Not on file  Other Topics Concern   Not on file  Social History Narrative   Not on file   Social  Drivers of Health   Financial Resource Strain: Not on file  Food Insecurity: Not on file  Transportation Needs: Not on file  Physical Activity: Not on file  Stress: Not on file  Social Connections: Not on file  Intimate Partner Violence: Not on file   ROS-see HPI   + = positive Constitutional:    weight loss, night sweats, fevers, chills, fatigue, lassitude. HEENT:    headaches, difficulty swallowing, tooth/dental problems, sore throat,       sneezing, itching, ear ache, nasal congestion, post nasal drip, snoring CV:    chest pain, orthopnea, PND, swelling in lower extremities, anasarca,                                   dizziness, palpitations Resp:   shortness of breath with exertion or at rest.                productive cough,   non-productive cough, coughing up of blood.              change in color of mucus.  wheezing.   Skin:    rash or lesions. GI:  No-   heartburn, indigestion, abdominal pain, nausea, vomiting, diarrhea,                 change in bowel habits, loss of appetite GU: dysuria, change in color of urine, no urgency or frequency.   flank pain. MS:   joint pain, stiffness, decreased range of motion, back pain. Neuro-     nothing unusual Psych:  change in mood or affect.  depression or anxiety.   memory loss.  OBJ- Physical Exam General- Alert, Oriented, Affect-appropriate, Distress- none acute Skin- rash-none, lesions- none, excoriation- none Lymphadenopathy- none Head- atraumatic            Eyes- Gross vision intact, PERRLA, conjunctivae and secretions clear            Ears- Hearing, canals-normal            Nose- Clear, no-Septal dev, mucus, polyps, erosion, perforation             Throat- Mallampati IV , mucosa clear , drainage- none, tonsils- atrophic, +teeth Neck- flexible , trachea midline, no stridor , thyroid nl, carotid no bruit Chest - symmetrical excursion , unlabored           Heart/CV- RRR , no murmur , no gallop  , no rub, nl s1 s2                            - JVD- none , edema- none, stasis changes- none, varices- none  Lung- clear to P&A, wheeze- none, cough- none , dullness-none, rub- none           Chest wall-  Abd-  Br/ Gen/ Rectal- Not done, not indicated Extrem- cyanosis- none, clubbing, none, atrophy- none, strength- nl Neuro- grossly intact to observation  Assessment and Plan    Obstructive Sleep Apnea Long-term CPAP user with good compliance. Current machine has exceeded manufacturer's hours. Patient has lost significant weight but continues to require CPAP. Nasal irritation from mask noted. -Replace main CPAP machine through insurance, considering an Airsense 10 or 11 model. -Consider nasal saline gel for nasal irritation from mask. -Encourage patient to discuss mask fitting with [facility] for optimal comfort and fit.  General Health Maintenance Significant weight loss and improved A1c due to lifestyle changes for diabetes management. -Continue current lifestyle modifications for diabetes management.

## 2023-12-28 ENCOUNTER — Encounter: Payer: Self-pay | Admitting: Internal Medicine

## 2023-12-28 ENCOUNTER — Ambulatory Visit: Payer: Managed Care, Other (non HMO) | Admitting: Internal Medicine

## 2023-12-28 VITALS — BP 128/82 | HR 81 | Ht 74.0 in | Wt 242.2 lb

## 2023-12-28 DIAGNOSIS — G4733 Obstructive sleep apnea (adult) (pediatric): Secondary | ICD-10-CM | POA: Diagnosis not present

## 2023-12-28 NOTE — Patient Instructions (Signed)
Order- [name]- please replace old CPAP machine-change to auto 5-15, mask of choice, humidifier, supplies, AirView/ card  We mentioned otc nasal saline gel for nasal dryness

## 2024-01-01 ENCOUNTER — Encounter: Payer: Self-pay | Admitting: Internal Medicine

## 2024-01-03 ENCOUNTER — Telehealth: Payer: Self-pay | Admitting: Internal Medicine

## 2024-01-03 LAB — HM DIABETES EYE EXAM

## 2024-01-03 NOTE — Telephone Encounter (Signed)
Message sent to [facility].

## 2024-01-03 NOTE — Telephone Encounter (Signed)
Patient needs prescription for cpap machine. He needs cpap and supplies to be sent to his [place] address and not his [place] address. Right now [facility] is trying to send it to his [facility] Address. He can also just have the prescription and order it from another company offline if necessary.He would also like to know what his copay would be for getting his Cpap from [place].    [address] [place] [address].     Call back number [phone]

## 2024-01-04 NOTE — Telephone Encounter (Signed)
[place], Johnell Comings, [employer]; [place], [place]; Loraine Grip; [place], Lurena Joiner Good morning Will have order processed and patient contacted.  Thank you!       Previous Messages    ----- Message ----- From: Darius Bump Sent: 01/03/2024   7:27 PM EST To: Durward Fortes; [facility]; Loraine Grip; * Subject: cpap                                          07-18-1966 Patient is wanting this sent to his [facility] Address.  Patient needs prescription for cpap machine. He needs cpap and supplies to be sent to his [place] address and not his  address. Right now [facility] is trying to send it to his [facility] Address. He can also just have the prescription and order it from another company offline if necessary.He would also like to know what his copay would be for getting his Cpap from [place].     [address] [place] [address].     Call back number [phone]      Order placed please advise. Thanks Chantel

## 2024-02-26 ENCOUNTER — Telehealth: Payer: Self-pay | Admitting: Internal Medicine

## 2024-02-26 ENCOUNTER — Encounter: Payer: Self-pay | Admitting: Internal Medicine

## 2024-02-26 NOTE — Telephone Encounter (Signed)
 Copied from CRM [id number]. Topic: Clinical - Lab/Test Results >> Feb 23, 2024  4:14 PM William [name] wrote: Reason for CRM: Patient received a message that his labs were effected due to the software error. He would like to know if this effected his results specifically or if he is fine.

## 2024-03-20 ENCOUNTER — Ambulatory Visit (HOSPITAL_BASED_OUTPATIENT_CLINIC_OR_DEPARTMENT_OTHER): Payer: Self-pay | Admitting: Family

## 2024-03-20 VITALS — BP 141/93 | HR 72 | Ht 74.0 in | Wt 255.0 lb

## 2024-03-20 DIAGNOSIS — F411 Generalized anxiety disorder: Secondary | ICD-10-CM | POA: Diagnosis not present

## 2024-03-20 DIAGNOSIS — F331 Major depressive disorder, recurrent, moderate: Secondary | ICD-10-CM | POA: Diagnosis not present

## 2024-03-20 MED ORDER — HYDROXYZINE PAMOATE 25 MG PO CAPS: 25.0000 mg | ORAL_CAPSULE | Freq: Three times a day (TID) | ORAL | 0 refills | Status: DC | PRN

## 2024-03-20 MED ORDER — SERTRALINE HCL 25 MG PO TABS: ORAL_TABLET | ORAL | 0 refills | Status: DC

## 2024-03-20 NOTE — Progress Notes (Addendum)
 Psychiatric Initial Adult Assessment   Patient Identification: William [name] MRN:  [id number] Date of Evaluation:  03/20/2024 Referral Source: Fayrene Fearing, MD  Chief Complaint: William [name] "I have no joy, I worry and ruminate over everything."  Visit Diagnosis:    ICD-10-CM   1. Anxiety state  F41.1     2. Major depressive disorder, recurrent episode, moderate (HCC)  F33.1 TSH    TSH      History of Present Illness:  William [name] 58 year old Caucasian male presents to establish care.  [name] he was referred by his primary care provider due to increased anxiety symptoms.  He [name] he has been struggling with symptoms for over 5+ years.  He [name] a history related to mental illness.  [name] he was followed by psychiatrist Simmie Davies.  [name] he has tried multiple psychotropic medications in the past however is unable to recall the name of any medications that he is taking 20 years ago.  He [name] his main symptoms include anxiety, mood swings, work related issues, sleep disturbance, panic attacks, obsessive thoughts and poor concentration.  He [name] he is prescribed Xanax and gabapentin 100 mg po TID by his primary care provider.  [name] he uses Xanax occasional to help with sleep. Stated that he hasn't noticed a difference with gabapentin use.   He [name] multiple psychosocial stressors that has been attributing to his increased anxiety.  [name] the recent passing of the nephew due to alcohol abuse.  [name] his wife was diagnosed with cancer, stated cancer treatment has been completed and she "cured/remission" .  William [name] he continues to experience increased anxiety and symptoms of worry, with ongoing ruminations.  William [name] [name] he has been married for the past 30 years.  [name] they have 2 children who are both married.  [name] his family is supportive.   Family history related to mental illness: [name] both of his grandfathers had alcohol abuse issues.  (Alcoholism)  William [name] [name] frustration related to his current employment status.  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  However,  work has not been consistent.  [name] he is transitioning back to [place] as his primary residence is in [place].  However has plans to sell the family home  William [name] denied previous inpatient admissions.  Does admit to some alcohol use occasionally.  Denied previous suicide attempts or self injures behaviors.  Denied history related to physical emotional or sexual abuse.  Has a documented history related to arthritis, back issues, diabetes, diverticulitis, hypertension, kidney disease and polyps.  PHQ-9 12 GAD-7 15.  Orders placed for TSH And GeneSight testing Will initiate Zoloft 25 to 50 mg daily Patient to follow-up 2 months for medication management   William [name] is sitting, pleasant calm cooperative mood is congruent with affect.  Patient is speaking in a clear tone at moderate volume, and normal pace; with good eye contact.   His thought process is coherent and relevant; There is no indication that he is currently responding to internal/external stimuli or experiencing delusional thought content.  Patient denies suicidal/self-harm/homicidal ideation, psychosis, and paranoia.  Patient has remained calm throughout assessment and has answered questions appropriately.    Associated Signs/Symptoms: Depression Symptoms:  depressed mood, insomnia, anxiety, disturbed sleep, (Hypo) Manic Symptoms:  Distractibility, Anxiety Symptoms:  Excessive Worry, Psychotic Symptoms:  Hallucinations: None PTSD Symptoms: NA  Past Psychiatric  History: Was followed by therapy psychiatry services in the past.  [name] attending marital counseling and was followed by psychiatrist William [name] for medication management 20+ years ago  Previous  Psychotropic Medications:  Unable to recall medication that is tried in the past  Substance Abuse History in the last 12 months:  No.  Consequences of Substance Abuse: NA  Past Medical History:  Past Medical History:  Diagnosis Date   Anxiety    Degenerative arthritis of lumbar spine 12/12/2013   Diabetes mellitus without complication (HCC)    on meds   Diverticulitis    Encounter for well adult exam with abnormal findings 07/12/2013   GERD (gastroesophageal reflux disease)    Hypertension    on meds   Sleep apnea    uses CPAP nightly    Past Surgical History:  Procedure Laterality Date   COLONOSCOPY  2018   JP-MAC-suprep (exc)-elongated redundant colon/int hem/tics/4 polyps   WISDOM TOOTH EXTRACTION      Family Psychiatric History: [name] family history related to alcoholism.  Both paternal and maternal grandfather  Family History:  Family History  Problem Relation Age of Onset   Ovarian cancer Mother 76   Hyperlipidemia Mother    Pancreatic cancer Father    Colon cancer Neg Hx    Colon polyps Neg Hx    Esophageal cancer Neg Hx    Stomach cancer Neg Hx    Rectal cancer Neg Hx     Social History:   Social History   Socioeconomic History   Marital status: Married    Spouse name: Not on file   Number of children: Not on file   Years of education: Not on file   Highest education level: Not on file  Occupational History   Occupation: Clinical biochemist: [employer]  Tobacco Use   Smoking status: Never   Smokeless tobacco: Never  Substance and Sexual Activity   Alcohol use: Yes    Alcohol/week: 0.0 - 1.0 standard drinks of alcohol    Comment: one drink per week, occ   Drug use: No   Sexual activity: Not on file  Other Topics Concern   Not on file  Social History Narrative   Not on file   Social Drivers of Health   Financial Resource Strain: Not on file  Food Insecurity: Not on file  Transportation Needs: Not on file  Physical  Activity: Not on file  Stress: Not on file  Social Connections: Not on file    Additional Social History:   Allergies:   Allergies  Allergen Reactions   Acetaminophen     Other reaction(s): Other (See Comments) "made me feel misplaced"   Blue Dyes (Parenteral)     Other reaction(s): Other (See Comments) Pt [name] he is "just allergic to dyes" Unknown as to what dye that is.  "made my skin break out in hives"   Cymbalta [Duloxetine Hcl] Nausea Only   Fenofibrate Micronized Nausea And Vomiting   Ivp Dye [Iodinated Contrast Media] Hives    Per pt after CT with IV contrast aug 2014   Metformin And Related Diarrhea   Niacin     Metabolic Disorder Labs: Lab Results  Component Value Date   HGBA1C 5.9 12/11/2023   No results found for: "PROLACTIN" Lab Results  Component Value Date   CHOL 163 12/11/2023   TRIG 163.0 (H) 12/11/2023   HDL 35.40 (L) 12/11/2023   CHOLHDL 5 12/11/2023   VLDL 32.6 12/11/2023  LDLCALC 95 12/11/2023   LDLCALC 105 (H) 06/22/2023   Lab Results  Component Value Date   TSH 1.58 12/11/2023    Therapeutic Level Labs: No results found for: "LITHIUM" No results found for: "CBMZ" No results found for: "VALPROATE"  Current Medications: Current Outpatient Medications  Medication Sig Dispense Refill   hydrOXYzine (VISTARIL) 25 MG capsule Take 1 capsule (25 mg total) by mouth 3 (three) times daily as needed. 60 capsule 0   sertraline (ZOLOFT) 25 MG tablet Take 1 tablet (25 mg total) by mouth daily for 5 days, THEN 2 tablets (50 mg total) daily. 60 tablet 0   ALPRAZolam (XANAX) 0.5 MG tablet Take 1 tablet (0.5 mg total) by mouth 2 (two) times daily as needed for anxiety. 60 tablet 1   empagliflozin (JARDIANCE) 10 MG TABS tablet Take 1 tablet (10 mg total) by mouth daily before breakfast. 90 tablet 3   gabapentin (NEURONTIN) 100 MG capsule Take 1 capsule (100 mg total) by mouth 3 (three) times daily. 90 capsule 5   metoprolol succinate (TOPROL-XL) 50 MG  24 hr tablet Take 1 tablet (50 mg total) by mouth daily. Take with or immediately following a meal. (Patient not taking: Reported on 12/28/2023) 90 tablet 3   tirzepatide (MOUNJARO) 5 MG/0.5ML Pen Inject 5 mg into the skin once a week. 6 mL 3   No current facility-administered medications for this visit.    Musculoskeletal: Strength & Muscle Tone: within normal limits Gait & Station: normal Patient leans: N/A  Psychiatric Specialty Exam: Review of Systems  Blood pressure (!) 141/93, pulse 72, height 6\' 2"  (1.88 m), weight 255 lb (115.7 kg).Body mass index is 32.74 kg/m.  General Appearance: Casual  Eye Contact:  Good  Speech:  Clear and Coherent  Volume:  Normal  Mood:  Anxious and Depressed  Affect:  Congruent  Thought Process:  Coherent  Orientation:  Full (Time, Place, and Person)  Thought Content:  Logical  Suicidal Thoughts:  No  Homicidal Thoughts:  No  Memory:  Immediate;   Good Remote;   Good  Judgement:  Good  Insight:  Good  Psychomotor Activity:  Normal  Concentration:  Concentration: Good  Recall:  Good  Fund of Knowledge:Good  Language: Good  Akathisia:  No  Handed:  Right  AIMS (if indicated):  not done  Assets:  Communication Skills Desire for Improvement Resilience Social Support  ADL's:  Intact  Cognition: WNL  Sleep:  Poor   Screenings: GAD-7    Loss adjuster, chartered Office Visit from 06/21/2023 in [facility] [place] [facility]  Total GAD-7 Score 5      PHQ2-9    Flowsheet Row [facility] Office Visit from 12/11/2023 in [facility] [place] Office Visit from 06/21/2023 in [facility] [facility] [place] Office Visit from 06/12/2023 in [facility] Office Visit from 01/30/2023 in [facility]  PHQ-2 Total Score 5 2 1  0 1  PHQ-9 Total Score 8 2 3  0 --        Assessment and Plan: Sevag Shearn 58 year old Caucasian male presents to establish care.  He [name] struggling with crippling anxiety symptoms.  [name] he has been struggling with anxiety and possibly depression for over 20 years.  [name] more recently his anxiety symptoms has become unbearable.  Currently prescribed Xanax and gabapentin through his primary care provider.  Denies  that he has been on any psychotropic medications for 20+ years.  Denies illicit drug use or substance abuse history.  [name] ongoing ruminations and symptoms of worry.  [name] financial concerns due to his intermittent unemployment status  Collaboration of Care: Medication Management AEB initiated Zoloft 25 mg titrate to 50 mg and 5 days - Orders placed for TSH and GeneSight testing. - Patient to follow-up 1 months for medication management  Patient/Guardian was advised Release of Information must be obtained prior to any record release in order to collaborate their care with an outside provider. Patient/Guardian was advised if they have not already done so to contact the registration department to sign all necessary forms in order for us  to release information regarding their care.   Consent: Patient/Guardian gives verbal consent for treatment and assignment of benefits for services provided during this visit. Patient/Guardian expressed understanding and agreed to proceed.   William Reagin, NP 4/16/20251:59 PM

## 2024-03-20 NOTE — Addendum Note (Signed)
 Addended by: Levester Reagin on: 03/20/2024 02:00 PM   Modules accepted: Level of Service

## 2024-03-21 LAB — TSH: TSH: 3.67 u[IU]/mL (ref 0.450–4.500)

## 2024-04-17 ENCOUNTER — Ambulatory Visit (HOSPITAL_COMMUNITY): Admitting: Family

## 2024-05-06 ENCOUNTER — Telehealth (HOSPITAL_COMMUNITY): Admitting: Family

## 2024-05-06 DIAGNOSIS — F411 Generalized anxiety disorder: Secondary | ICD-10-CM | POA: Diagnosis not present

## 2024-05-06 DIAGNOSIS — F331 Major depressive disorder, recurrent, moderate: Secondary | ICD-10-CM | POA: Diagnosis not present

## 2024-05-06 MED ORDER — DESVENLAFAXINE SUCCINATE ER 25 MG PO TB24: 25.0000 mg | ORAL_TABLET | Freq: Every day | ORAL | 60 refills | Status: DC

## 2024-05-06 NOTE — Progress Notes (Signed)
 Virtual Visit via Video Note  I connected with William [name] on 05/06/24 at  1:30 PM EDT by a video enabled telemedicine application and verified that I am speaking with the correct person using two identifiers.  Location: Patient: Home Provider: Office   I discussed the limitations of evaluation and management by telemedicine and the availability of in person appointments. The patient expressed understanding and agreed to proceed.   I discussed the assessment and treatment plan with the patient. The patient was provided an opportunity to ask questions and all were answered. The patient agreed with the plan and demonstrated an understanding of the instructions.   The patient was advised to call back or seek an in-person evaluation if the symptoms worsen or if the condition fails to improve as anticipated.  I provided 20 minutes of non-face-to-face time during this encounter.   Levester Reagin, NP   BH MD/PA/NP OP Progress Note  05/06/2024 5:59 PM CAS TRACZ  MRN:  [id number]  Chief Complaint: Medication management  HPI:  William [name] is a 58 year male who presents for medication management follow-up appointment.  Per initial assessment patient was initiated on Zoloft  25 mg with scheduled to taper to 50 mg daily.  States he was unable to tolerate medication.  He was initiated on hydroxyzine  and states he feels "loopy" when taking this medication.  Reports high sensitivity to medications.  States he continues to struggle with racing thoughts and increased depression.  States he has early morning awakenings around 2 AM to 5 AM where he is unable to go back to sleep.  Coupled with getting his house ready to sell.  Reports he she has been dealing with a lot of psychosocial stressors around this time.  GeneSight test results reviewed for antidepressants.  He was amendable to starting Pristiq 25 mg to 50 mg daily.  Discussed tapering medication will make results available.  No concerns  related to suicidal or homicidal ideations.  Denies auditory or visual hallucinations.  Discontinued Zoloft  and hydroxyzine .  He reports primary care provider made Xanax  0.5 mg available as needed.  He states medication has helped with sleep disturbance.  Support encouragement reassurance was provided.  Patient to follow-up 3 months for medication adherence/tolerability.     Visit Diagnosis:    ICD-10-CM   1. Major depressive disorder, recurrent episode, moderate (HCC)  F33.1     2. Anxiety state  F41.1       Past Psychiatric History:   Past Medical History:  Past Medical History:  Diagnosis Date   Anxiety    Degenerative arthritis of lumbar spine 12/12/2013   Diabetes mellitus without complication (HCC)    on meds   Diverticulitis    Encounter for well adult exam with abnormal findings 07/12/2013   GERD (gastroesophageal reflux disease)    Hypertension    on meds   Sleep apnea    uses CPAP nightly    Past Surgical History:  Procedure Laterality Date   COLONOSCOPY  2018   JP-MAC-suprep (exc)-elongated redundant colon/int hem/tics/4 polyps   WISDOM TOOTH EXTRACTION      Family Psychiatric History:   Family History:  Family History  Problem Relation Age of Onset   Ovarian cancer Mother 62   Hyperlipidemia Mother    Pancreatic cancer Father    Colon cancer Neg Hx    Colon polyps Neg Hx    Esophageal cancer Neg Hx    Stomach cancer Neg Hx    Rectal cancer  Neg Hx     Social History:  Social History   Socioeconomic History   Marital status: Married    Spouse name: Not on file   Number of children: Not on file   Years of education: Not on file   Highest education level: Not on file  Occupational History   Occupation: Retail banker    Employer: [employer]  Tobacco Use   Smoking status: Never   Smokeless tobacco: Never  Substance and Sexual Activity   Alcohol use: Yes    Alcohol/week: 0.0 - 1.0 standard drinks of alcohol    Comment: one drink per  week, occ   Drug use: No   Sexual activity: Not on file  Other Topics Concern   Not on file  Social History Narrative   Not on file   Social Drivers of Health   Financial Resource Strain: Not on file  Food Insecurity: Not on file  Transportation Needs: Not on file  Physical Activity: Not on file  Stress: Not on file  Social Connections: Not on file    Allergies:  Allergies  Allergen Reactions   Acetaminophen      Other reaction(s): Other (See Comments) "made me feel misplaced"   Blue Dyes (Parenteral)     Other reaction(s): Other (See Comments) Pt states he is "just allergic to dyes" Unknown as to what dye that is.  "made my skin break out in hives"   Cymbalta  [Duloxetine  Hcl] Nausea Only   Fenofibrate  Micronized Nausea And Vomiting   Ivp Dye [Iodinated Contrast Media] Hives    Per pt after CT with IV contrast aug 2014   Metformin  And Related Diarrhea   Niacin     Metabolic Disorder Labs: Lab Results  Component Value Date   HGBA1C 5.9 12/11/2023   No results found for: "PROLACTIN" Lab Results  Component Value Date   CHOL 163 12/11/2023   TRIG 163.0 (H) 12/11/2023   HDL 35.40 (L) 12/11/2023   CHOLHDL 5 12/11/2023   VLDL 32.6 12/11/2023   LDLCALC 95 12/11/2023   LDLCALC 105 (H) 06/22/2023   Lab Results  Component Value Date   TSH 3.670 03/20/2024   TSH 1.58 12/11/2023    Therapeutic Level Labs: No results found for: "LITHIUM" No results found for: "VALPROATE" No results found for: "CBMZ"  Current Medications: Current Outpatient Medications  Medication Sig Dispense Refill   desvenlafaxine (PRISTIQ) 25 MG 24 hr tablet Take 1 tablet (25 mg total) by mouth daily. 30 tablet 60   ALPRAZolam  (XANAX ) 0.5 MG tablet Take 1 tablet (0.5 mg total) by mouth 2 (two) times daily as needed for anxiety. 60 tablet 1   empagliflozin  (JARDIANCE ) 10 MG TABS tablet Take 1 tablet (10 mg total) by mouth daily before breakfast. 90 tablet 3   gabapentin  (NEURONTIN ) 100 MG  capsule Take 1 capsule (100 mg total) by mouth 3 (three) times daily. 90 capsule 5   metoprolol  succinate (TOPROL -XL) 50 MG 24 hr tablet Take 1 tablet (50 mg total) by mouth daily. Take with or immediately following a meal. (Patient not taking: Reported on 12/28/2023) 90 tablet 3   tirzepatide  (MOUNJARO ) 5 MG/0.5ML Pen Inject 5 mg into the skin once a week. 6 mL 3   No current facility-administered medications for this visit.     Musculoskeletal: Virtual assessment  Psychiatric Specialty Exam: Review of Systems  There were no vitals taken for this visit.There is no height or weight on file to calculate BMI.  General Appearance:  Casual  Eye Contact:  Good  Speech:  Clear and Coherent  Volume:  Normal  Mood:  Anxious and Depressed  Affect:  Congruent  Thought Process:  Coherent  Orientation:  Full (Time, Place, and Person)  Thought Content: Logical   Suicidal Thoughts:  No  Homicidal Thoughts:  No  Memory:  Immediate;   Good Recent;   Good  Judgement:  Good  Insight:  Good  Psychomotor Activity:  Normal  Concentration:  Concentration: Good  Recall:  Good  Fund of Knowledge: Good  Language: Good  Akathisia:  No  Handed:  Right  AIMS (if indicated): not done  Assets:  Communication Skills Desire for Improvement Social Support  ADL's:  Intact  Cognition: WNL  Sleep:  Poor   Screenings: GAD-7    Loss adjuster, chartered Office Visit from 06/21/2023 in [facility]  Total GAD-7 Score 5      PHQ2-9    Flowsheet Row Office Visit from [facility]/20/[facility]24 [facility] Office Visit from 12/11/2023 in [facility] [place] [facility] [place] Office Visit from 06/21/2023 in [facility] [place] Office Visit from 06/12/2023 in [facility] Office Visit from 01/30/2023 in [facility]  PHQ-2 Total Score 5 2 1  0 1  PHQ-9 Total Score  8 2 3  0 --        Assessment and Plan: William [name] 58 year old Caucasian male presents for medication management follow-up appointment.  Continues to endorse increased anxiety and depression symptoms.  States he has been taking Xanax  0.5 mg as needed for sleep disturbance and racing thoughts.  He was initiated on Zoloft  on a tapered schedule however states he was unable to tolerate medications.  GeneSight testing reviewed May use Pristiq (desvenlafaxine) and fetzima ( levomilnacipran) as directed.  Results sent secured for further review.  Was amendable to starting Pristiq 25 mg x 7 days with titration to 50 mg.  Patient to follow-up 3 months for medication adherence/tolerability.  Collaboration of Care: Collaboration of Care: Medication Management AEB patient to start Pristiq 25 to 50 mg daily  Patient/Guardian was advised Release of Information must be obtained prior to any record release in order to collaborate their care with an outside provider. Patient/Guardian was advised if they have not already done so to contact the registration department to sign all necessary forms in order for us  to release information regarding their care.   Consent: Patient/Guardian gives verbal consent for treatment and assignment of benefits for services provided during this visit. Patient/Guardian expressed understanding and agreed to proceed.    Levester Reagin, NP 05/06/2024, 5:59 PM

## 2024-05-07 ENCOUNTER — Telehealth (HOSPITAL_COMMUNITY): Payer: Self-pay | Admitting: *Deleted

## 2024-05-07 NOTE — Telephone Encounter (Signed)
 PA for Pristiq (Desvenlafaxine Succinate ER) 25 mg tabs submitted to [employer] via Cover My Meds portal. This medication has been Approved from 04/07/24 through 05/07/25.

## 2024-05-24 ENCOUNTER — Other Ambulatory Visit: Payer: Self-pay | Admitting: Internal Medicine

## 2024-06-05 ENCOUNTER — Other Ambulatory Visit (HOSPITAL_COMMUNITY): Payer: Self-pay

## 2024-06-05 MED ORDER — DESVENLAFAXINE SUCCINATE ER 25 MG PO TB24: 25.0000 mg | ORAL_TABLET | Freq: Every day | ORAL | 0 refills | Status: AC

## 2024-06-10 ENCOUNTER — Encounter: Payer: Managed Care, Other (non HMO) | Admitting: Internal Medicine

## 2024-07-01 ENCOUNTER — Encounter: Payer: Self-pay | Admitting: Internal Medicine

## 2024-07-01 ENCOUNTER — Other Ambulatory Visit (INDEPENDENT_AMBULATORY_CARE_PROVIDER_SITE_OTHER)

## 2024-07-01 ENCOUNTER — Ambulatory Visit (INDEPENDENT_AMBULATORY_CARE_PROVIDER_SITE_OTHER): Admitting: Internal Medicine

## 2024-07-01 VITALS — BP 131/85 | HR 68 | Temp 98.2°F | Ht 74.0 in | Wt 243.0 lb

## 2024-07-01 DIAGNOSIS — E7800 Pure hypercholesterolemia, unspecified: Secondary | ICD-10-CM

## 2024-07-01 DIAGNOSIS — E559 Vitamin D deficiency, unspecified: Secondary | ICD-10-CM | POA: Diagnosis not present

## 2024-07-01 DIAGNOSIS — Z7984 Long term (current) use of oral hypoglycemic drugs: Secondary | ICD-10-CM

## 2024-07-01 DIAGNOSIS — I10 Essential (primary) hypertension: Secondary | ICD-10-CM

## 2024-07-01 DIAGNOSIS — E1140 Type 2 diabetes mellitus with diabetic neuropathy, unspecified: Secondary | ICD-10-CM

## 2024-07-01 DIAGNOSIS — E538 Deficiency of other specified B group vitamins: Secondary | ICD-10-CM

## 2024-07-01 DIAGNOSIS — Z125 Encounter for screening for malignant neoplasm of prostate: Secondary | ICD-10-CM

## 2024-07-01 LAB — BASIC METABOLIC PANEL WITH GFR
BUN: 15 mg/dL (ref 6–23)
CO2: 27 meq/L (ref 19–32)
Calcium: 9.2 mg/dL (ref 8.4–10.5)
Chloride: 101 meq/L (ref 96–112)
Creatinine, Ser: 0.97 mg/dL (ref 0.40–1.50)
GFR: 84.82 mL/min (ref >60.00)
Glucose, Bld: 114 mg/dL — ABNORMAL HIGH (ref 70–99)
Potassium: 3.9 meq/L (ref 3.5–5.1)
Sodium: 137 meq/L (ref 135–145)

## 2024-07-01 LAB — LIPID PANEL
Cholesterol: 162 mg/dL (ref 0–200)
HDL: 37.3 mg/dL — ABNORMAL LOW (ref >39.00)
LDL Cholesterol: 91 mg/dL (ref 0–99)
NonHDL: 125.05
Total CHOL/HDL Ratio: 4
Triglycerides: 169 mg/dL — ABNORMAL HIGH (ref 0.0–149.0)
VLDL: 33.8 mg/dL (ref 0.0–40.0)

## 2024-07-01 LAB — HEPATIC FUNCTION PANEL
ALT: 27 U/L (ref 0–53)
AST: 25 U/L (ref 0–37)
Albumin: 4.6 g/dL (ref 3.5–5.2)
Alkaline Phosphatase: 66 U/L (ref 39–117)
Bilirubin, Direct: 0.2 mg/dL (ref 0.0–0.3)
Total Bilirubin: 1 mg/dL (ref 0.2–1.2)
Total Protein: 7.4 g/dL (ref 6.0–8.3)

## 2024-07-01 LAB — HEMOGLOBIN A1C: Hgb A1c MFr Bld: 6.2 % (ref 4.6–6.5)

## 2024-07-01 MED ORDER — TIRZEPATIDE 7.5 MG/0.5ML ~~LOC~~ SOAJ: 7.5000 mg | SUBCUTANEOUS | 3 refills | Status: DC

## 2024-07-01 NOTE — Assessment & Plan Note (Signed)
 Last vitamin D  Lab Results  Component Value Date   VD25OH 22.35 (L) 12/11/2023   Low, to start oral replacement

## 2024-07-01 NOTE — Patient Instructions (Addendum)
 Ok to increase the Mounjaro  to 7.5 mg weekly  Ok to stay off the gabapentin  since this was not helping the lower back pain or neuropathy  Please continue all other medications as before, and refills have been done if requested.  Please have the pharmacy call with any other refills you may need.  Please continue your efforts at being more active, low cholesterol diet, and weight control.  Please keep your appointments with your specialists as you may have planned  Please go to the LAB at the blood drawing area for the tests to be done  You will be contacted by phone if any changes need to be made immediately.  Otherwise, you will receive a letter about your results with an explanation, but please check with MyChart first.  Please make an Appointment to return in 6 months, or sooner if needed, also with Lab Appointment for testing done 3-5 days before at the FIRST FLOOR Lab (so this is for TWO appointments - please see the scheduling desk as you leave)

## 2024-07-01 NOTE — Assessment & Plan Note (Signed)
 Lab Results  Component Value Date   LDLCALC 95 12/11/2023   Uncontrolled, declines statin for now, for f/u lab today

## 2024-07-01 NOTE — Addendum Note (Signed)
 Addended by: NORLEEN LYNWOOD ORN on: 07/01/2024 02:27 PM   Modules accepted: Orders

## 2024-07-01 NOTE — Assessment & Plan Note (Signed)
 Lab Results  Component Value Date   HGBA1C 5.9 12/11/2023   With uncontrolled obesity pt to continue current medical treatment jardiance  10 every day, but increase the mounjaro  to 7.5 mg weekly

## 2024-07-01 NOTE — Progress Notes (Signed)
 Patient ID: [name], male   DOB: 1966-05-22, 58 y.o.   MRN: [id number]        Chief Complaint: follow up DM with neuropathy, low vit d, dm, htn, hld       HPI:  William [name] is a 58 y.o. male here overall doing ok,  Pt denies chest pain, increased sob or doe, wheezing, orthopnea, PND, increased LE swelling, palpitations, dizziness or syncope.   Pt denies polydipsia, polyuria, or new focal neuro s/s.    Pt denies fever, wt loss, night sweats, loss of appetite, or other constitutional symptoms    Has ongoing bilat plantar fascitis and neuropathy seems better with new inserts, has seen podiatry,  Has seen psychiatry now after genetic testing - now on pristiq  for anxiety.  Wt has plateued with mounjaro  5 mg, needs further wt loss.  Gabapentin  stopped as did not seem to help neuropathy or back pain.  Wt Readings from Last 3 Encounters:  07/01/24 243 lb (110.2 kg)  12/28/23 242 lb 3.2 oz (109.9 kg)  12/11/23 243 lb (110.2 kg)   BP Readings from Last 3 Encounters:  07/01/24 131/85  12/28/23 128/82  12/11/23 124/78         Past Medical History:  Diagnosis Date   Anxiety    Degenerative arthritis of lumbar spine 12/12/2013   Diabetes mellitus without complication (HCC)    on meds   Diverticulitis    Encounter for well adult exam with abnormal findings 07/12/2013   GERD (gastroesophageal reflux disease)    Hypertension    on meds   Sleep apnea    uses CPAP nightly   Past Surgical History:  Procedure Laterality Date   COLONOSCOPY  2018   JP-MAC-suprep (exc)-elongated redundant colon/int hem/tics/4 polyps   WISDOM TOOTH EXTRACTION      reports that he has never smoked. He has never used smokeless tobacco. He reports current alcohol use. He reports that he does not use drugs. family history includes Hyperlipidemia in his mother; Ovarian cancer (age of onset: 32) in his mother; Pancreatic cancer in his father. Allergies  Allergen Reactions   Acetaminophen      Other reaction(s):  Other (See Comments) made me feel misplaced   Blue Dyes (Parenteral)     Other reaction(s): Other (See Comments) Pt states he is just allergic to dyes Unknown as to what dye that is.  made my skin break out in hives   Cymbalta  [Duloxetine  Hcl] Nausea Only   Fenofibrate  Micronized Nausea And Vomiting   Ivp Dye [Iodinated Contrast Media] Hives    Per pt after CT with IV contrast aug 2014   Metformin  And Related Diarrhea   Niacin    Current Outpatient Medications on File Prior to Visit  Medication Sig Dispense Refill   ALPRAZolam  (XANAX ) 0.5 MG tablet TAKE 1 TABLET BY MOUTH 2 TIMES DAILY AS NEEDED FOR ANXIETY. 60 tablet 1   desvenlafaxine  (PRISTIQ ) 25 MG 24 hr tablet Take 1 tablet (25 mg total) by mouth daily. 90 tablet 0   empagliflozin  (JARDIANCE ) 10 MG TABS tablet Take 1 tablet (10 mg total) by mouth daily before breakfast. 90 tablet 3   gabapentin  (NEURONTIN ) 100 MG capsule Take 1 capsule (100 mg total) by mouth 3 (three) times daily. 90 capsule 5   metoprolol  succinate (TOPROL -XL) 50 MG 24 hr tablet Take 1 tablet (50 mg total) by mouth daily. Take with or immediately following a meal. (Patient not taking: Reported on 07/01/2024) 90 tablet 3  No current facility-administered medications on file prior to visit.        ROS:  All others reviewed and negative.  Objective        PE:  BP 131/85   Pulse 68   Temp 98.2 F (36.8 C) (Oral)   Ht 6' 2 (1.88 m)   Wt 243 lb (110.2 kg)   SpO2 98%   BMI 31.20 kg/m                 Constitutional: Pt appears in NAD               HENT: Head: NCAT.                Right Ear: External ear normal.                 Left Ear: External ear normal.                Eyes: . Pupils are equal, round, and reactive to light. Conjunctivae and EOM are normal               Nose: without d/c or deformity               Neck: Neck supple. Gross normal ROM               Cardiovascular: Normal rate and regular rhythm.                 Pulmonary/Chest:  Effort normal and breath sounds without rales or wheezing.                Abd:  Soft, NT, ND, + BS, no organomegaly               Neurological: Pt is alert. At baseline orientation, motor grossly intact               Skin: Skin is warm. No rashes, no other new lesions, LE edema - none               Psychiatric: Pt behavior is normal without agitation   Micro: none  Cardiac tracings I have personally interpreted today:  none  Pertinent Radiological findings (summarize): none   Lab Results  Component Value Date   WBC 7.8 12/08/2023   HGB 16.7 12/08/2023   HCT 49.3 12/08/2023   PLT 166.0 12/08/2023   GLUCOSE 137 (H) 12/11/2023   CHOL 163 12/11/2023   TRIG 163.0 (H) 12/11/2023   HDL 35.40 (L) 12/11/2023   LDLDIRECT 73.0 06/09/2023   LDLCALC 95 12/11/2023   ALT 23 12/11/2023   AST 20 12/11/2023   NA 138 12/11/2023   K 4.3 12/11/2023   CL 101 12/11/2023   CREATININE 0.95 12/11/2023   BUN 11 12/11/2023   CO2 29 12/11/2023   TSH 3.670 03/20/2024   PSA 0.39 12/11/2023   HGBA1C 5.9 12/11/2023   Assessment/Plan:  [name] is a 58 y.o. White or Caucasian [1] male with  has a past medical history of Anxiety, Degenerative arthritis of lumbar spine (12/12/2013), Diabetes mellitus without complication (HCC), Diverticulitis, Encounter for well adult exam with abnormal findings (07/12/2013), GERD (gastroesophageal reflux disease), Hypertension, and Sleep apnea.  Vitamin D  deficiency Last vitamin D  Lab Results  Component Value Date   VD25OH 22.35 (L) 12/11/2023   Low, to start oral replacement   Diabetes mellitus with diabetic neuropathy [facility]) Lab Results  Component Value Date   HGBA1C 5.9 12/11/2023  With uncontrolled obesity pt to continue current medical treatment jardiance  10 every day, but increase the mounjaro  to 7.5 mg weekly   Essential hypertension BP Readings from Last 3 Encounters:  07/01/24 131/85  12/28/23 128/82  12/11/23 124/78   Stable, pt to  continue medical treatment toprol  xl 50 qd  Hyperlipidemia Lab Results  Component Value Date   LDLCALC 95 12/11/2023   Uncontrolled, declines statin for now, for f/u lab today  Followup: Return in about 6 months (around 01/01/2025).  Lynwood Rush, MD 07/01/2024 2:25 PM [facility]

## 2024-07-01 NOTE — Assessment & Plan Note (Signed)
 BP Readings from Last 3 Encounters:  07/01/24 131/85  12/28/23 128/82  12/11/23 124/78   Stable, pt to continue medical treatment toprol  xl 50 qd

## 2024-07-02 ENCOUNTER — Ambulatory Visit: Payer: Self-pay | Admitting: Internal Medicine

## 2024-07-02 LAB — VITAMIN D 25 HYDROXY (VIT D DEFICIENCY, FRACTURES): VITD: 25.91 ng/mL — ABNORMAL LOW (ref 30.00–100.00)

## 2024-07-02 NOTE — Progress Notes (Signed)
 The test results show that your current treatment is OK, as the tests are stable.  Please continue the same plan.  There is no other need for change of treatment or further evaluation based on these results, at this time.  thanks

## 2024-11-24 ENCOUNTER — Other Ambulatory Visit: Payer: Self-pay | Admitting: Internal Medicine

## 2024-11-25 ENCOUNTER — Other Ambulatory Visit: Payer: Self-pay

## 2024-12-04 ENCOUNTER — Other Ambulatory Visit: Payer: Self-pay | Admitting: Internal Medicine

## 2024-12-06 ENCOUNTER — Other Ambulatory Visit (INDEPENDENT_AMBULATORY_CARE_PROVIDER_SITE_OTHER)

## 2024-12-06 DIAGNOSIS — Z125 Encounter for screening for malignant neoplasm of prostate: Secondary | ICD-10-CM

## 2024-12-06 DIAGNOSIS — E1140 Type 2 diabetes mellitus with diabetic neuropathy, unspecified: Secondary | ICD-10-CM | POA: Diagnosis not present

## 2024-12-06 DIAGNOSIS — E538 Deficiency of other specified B group vitamins: Secondary | ICD-10-CM

## 2024-12-06 DIAGNOSIS — E559 Vitamin D deficiency, unspecified: Secondary | ICD-10-CM | POA: Diagnosis not present

## 2024-12-06 LAB — TSH: TSH: 4.83 u[IU]/mL (ref 0.35–5.50)

## 2024-12-06 LAB — LIPID PANEL
Cholesterol: 184 mg/dL (ref 28–200)
HDL: 33.7 mg/dL — ABNORMAL LOW
LDL Cholesterol: 86 mg/dL (ref 10–99)
NonHDL: 150.79
Total CHOL/HDL Ratio: 5
Triglycerides: 326 mg/dL — ABNORMAL HIGH (ref 10.0–149.0)
VLDL: 65.2 mg/dL — ABNORMAL HIGH (ref 0.0–40.0)

## 2024-12-06 LAB — URINALYSIS, ROUTINE W REFLEX MICROSCOPIC
Bilirubin Urine: NEGATIVE
Hgb urine dipstick: NEGATIVE
Ketones, ur: NEGATIVE
Leukocytes,Ua: NEGATIVE
Nitrite: NEGATIVE
RBC / HPF: NONE SEEN
Specific Gravity, Urine: 1.01 (ref 1.000–1.030)
Total Protein, Urine: NEGATIVE
Urine Glucose: 500 — AB
Urobilinogen, UA: 0.2 (ref 0.0–1.0)
pH: 5.5 (ref 5.0–8.0)

## 2024-12-06 LAB — MICROALBUMIN / CREATININE URINE RATIO
Creatinine,U: 67.1 mg/dL
Microalb Creat Ratio: UNDETERMINED mg/g (ref 0.0–30.0)
Microalb, Ur: <0.7 mg/dL

## 2024-12-06 LAB — CBC WITH DIFFERENTIAL/PLATELET
Basophils Absolute: 0.1 K/uL (ref 0.0–0.1)
Basophils Relative: 1.1 % (ref 0.0–3.0)
Eosinophils Absolute: 0.3 K/uL (ref 0.0–0.7)
Eosinophils Relative: 3.5 % (ref 0.0–5.0)
HCT: 47.2 % (ref 39.0–52.0)
Hemoglobin: 16.3 g/dL (ref 13.0–17.0)
Lymphocytes Relative: 37.9 % (ref 12.0–46.0)
Lymphs Abs: 3.2 K/uL (ref 0.7–4.0)
MCHC: 34.5 g/dL (ref 30.0–36.0)
MCV: 87.2 fl (ref 78.0–100.0)
Monocytes Absolute: 0.6 K/uL (ref 0.1–1.0)
Monocytes Relative: 7.6 % (ref 3.0–12.0)
Neutro Abs: 4.2 K/uL (ref 1.4–7.7)
Neutrophils Relative %: 49.9 % (ref 43.0–77.0)
Platelets: 160 K/uL (ref 150.0–400.0)
RBC: 5.41 Mil/uL (ref 4.22–5.81)
RDW: 12.9 % (ref 11.5–15.5)
WBC: 8.3 K/uL (ref 4.0–10.5)

## 2024-12-06 LAB — HEPATIC FUNCTION PANEL
ALT: 32 U/L (ref 3–53)
AST: 22 U/L (ref 5–37)
Albumin: 4.5 g/dL (ref 3.5–5.2)
Alkaline Phosphatase: 70 U/L (ref 39–117)
Bilirubin, Direct: 0.1 mg/dL (ref 0.1–0.3)
Total Bilirubin: 0.8 mg/dL (ref 0.2–1.2)
Total Protein: 7 g/dL (ref 6.0–8.3)

## 2024-12-06 LAB — HEMOGLOBIN A1C: Hgb A1c MFr Bld: 6.1 % (ref 4.6–6.5)

## 2024-12-06 LAB — BASIC METABOLIC PANEL WITH GFR
BUN: 13 mg/dL (ref 6–23)
CO2: 27 meq/L (ref 19–32)
Calcium: 8.7 mg/dL (ref 8.4–10.5)
Chloride: 102 meq/L (ref 96–112)
Creatinine, Ser: 0.99 mg/dL (ref 0.40–1.50)
GFR: 82.52 mL/min
Glucose, Bld: 116 mg/dL — ABNORMAL HIGH (ref 70–99)
Potassium: 4.2 meq/L (ref 3.5–5.1)
Sodium: 136 meq/L (ref 135–145)

## 2024-12-06 LAB — VITAMIN D 25 HYDROXY (VIT D DEFICIENCY, FRACTURES): VITD: 17.95 ng/mL — ABNORMAL LOW (ref 30.00–100.00)

## 2024-12-06 LAB — VITAMIN B12: Vitamin B-12: 360 pg/mL (ref 211–911)

## 2024-12-06 LAB — PSA: PSA: 0.32 ng/mL (ref 0.10–4.00)

## 2024-12-09 ENCOUNTER — Ambulatory Visit: Admitting: Internal Medicine

## 2024-12-09 ENCOUNTER — Encounter: Payer: Self-pay | Admitting: Internal Medicine

## 2024-12-09 VITALS — BP 120/80 | HR 75 | Temp 98.1°F | Ht 74.0 in | Wt 264.0 lb

## 2024-12-09 DIAGNOSIS — I10 Essential (primary) hypertension: Secondary | ICD-10-CM

## 2024-12-09 DIAGNOSIS — E1140 Type 2 diabetes mellitus with diabetic neuropathy, unspecified: Secondary | ICD-10-CM | POA: Diagnosis not present

## 2024-12-09 DIAGNOSIS — Z0000 Encounter for general adult medical examination without abnormal findings: Secondary | ICD-10-CM

## 2024-12-09 DIAGNOSIS — Z7984 Long term (current) use of oral hypoglycemic drugs: Secondary | ICD-10-CM

## 2024-12-09 DIAGNOSIS — Z7985 Long-term (current) use of injectable non-insulin antidiabetic drugs: Secondary | ICD-10-CM | POA: Diagnosis not present

## 2024-12-09 DIAGNOSIS — E559 Vitamin D deficiency, unspecified: Secondary | ICD-10-CM

## 2024-12-09 DIAGNOSIS — Z0001 Encounter for general adult medical examination with abnormal findings: Secondary | ICD-10-CM

## 2024-12-09 DIAGNOSIS — E7800 Pure hypercholesterolemia, unspecified: Secondary | ICD-10-CM | POA: Diagnosis not present

## 2024-12-09 MED ORDER — METOPROLOL SUCCINATE ER 50 MG PO TB24: 50.0000 mg | ORAL_TABLET | Freq: Every day | ORAL | 3 refills | Status: AC

## 2024-12-09 MED ORDER — EMPAGLIFLOZIN 10 MG PO TABS: 10.0000 mg | ORAL_TABLET | Freq: Every day | ORAL | 3 refills | Status: DC

## 2024-12-09 MED ORDER — TIRZEPATIDE 10 MG/0.5ML ~~LOC~~ SOAJ: 10.0000 mg | SUBCUTANEOUS | 3 refills | Status: DC

## 2024-12-09 MED ORDER — EMPAGLIFLOZIN 10 MG PO TABS: 10.0000 mg | ORAL_TABLET | Freq: Every day | ORAL | 3 refills | Status: AC

## 2024-12-09 MED ORDER — TIRZEPATIDE 10 MG/0.5ML ~~LOC~~ SOAJ: 10.0000 mg | SUBCUTANEOUS | 3 refills | Status: AC

## 2024-12-09 MED ORDER — METOPROLOL SUCCINATE ER 50 MG PO TB24: 50.0000 mg | ORAL_TABLET | Freq: Every day | ORAL | 3 refills | Status: DC

## 2024-12-09 NOTE — Assessment & Plan Note (Signed)
 Last vitamin D  Lab Results  Component Value Date   VD25OH 17.95 (L) 12/06/2024   Low, to start oral replacement

## 2024-12-09 NOTE — Assessment & Plan Note (Addendum)
 Without insulin  Lab Results  Component Value Date   HGBA1C 6.1 12/06/2024   With worsening uncontrolled obesity,  pt to continue current medical treatment jardiance  10 every day, and increase the mounjaro  to 10 mg weekly

## 2024-12-09 NOTE — Assessment & Plan Note (Signed)
 BP Readings from Last 3 Encounters:  12/09/24 120/80  07/01/24 131/85  12/28/23 128/82   Stable, pt to continue medical treatment toprol  xl 50 qd

## 2024-12-09 NOTE — Progress Notes (Signed)
 Patient ID: William [name], male   DOB: [birth year], 59 y.o.   MRN: [id number]         Chief Complaint:: wellness exam and low vitd, hld, DM with obesity, depression, insomnia       HPI:  William [name] is a 59 y.o. male here for wellness exam; for tdap and shiingrix at pharmacy                        Also drinking more tart cherry juice and seems to help arthalgias in the hands and other.   Pt denies chest pain, increased sob or doe, wheezing, orthopnea, PND, increased LE swelling, palpitations, dizziness or syncope.   Pt denies polydipsia, polyuria, or new focal neuro s/s.   Pt denies fever, wt loss, night sweats, loss of appetite, or other constitutional symptoms  Denies worsening depressive symptoms, suicidal ideation, or panic; has been out of pristiq  for several months, pt is not sure he wants to restart though still has mild chronic depressive symptoms intermittently active.  Has been unable to lose wt recently.  Pt will consider repatha but continues to declines statin.  Has ongoing chronic insomnia, stable with xanax  at bedtime.   Wt Readings from Last 3 Encounters:  12/09/24 264 lb (119.7 kg)  07/01/24 243 lb (110.2 kg)  12/28/23 242 lb 3.2 oz (109.9 kg)   BP Readings from Last 3 Encounters:  12/09/24 120/80  07/01/24 131/85  12/28/23 128/82   Immunization History  Administered Date(s) Administered   PNEUMOCOCCAL CONJUGATE-20 12/12/2022   Td 12/06/2003   Health Maintenance Due  Topic Date Due   DTaP/Tdap/Td (2 - Tdap) 12/05/2013   Zoster Vaccines- Shingrix (1 of 2) Never done      Past Medical History:  Diagnosis Date   Anxiety    Degenerative arthritis of lumbar spine 12/12/2013   Diabetes mellitus without complication (HCC)    on meds   Diverticulitis    Encounter for well adult exam with abnormal findings 07/12/2013   GERD (gastroesophageal reflux disease)    Hypertension    on meds   Sleep apnea    uses CPAP nightly   Past Surgical History:  Procedure  Laterality Date   COLONOSCOPY  2018   JP-MAC-suprep (exc)-elongated redundant colon/int hem/tics/4 polyps   WISDOM TOOTH EXTRACTION      reports that he has never smoked. He has never used smokeless tobacco. He reports current alcohol use. He reports that he does not use drugs. family history includes Hyperlipidemia in his mother; Ovarian cancer (age of onset: 43) in his mother; Pancreatic cancer in his father. Allergies[1] Medications Ordered Prior to Encounter[2]      ROS:  All others reviewed and negative.  Objective        PE:  BP 120/80 (BP Location: Right Arm, Patient Position: Sitting, Cuff Size: Normal)   Pulse 75   Temp 98.1 F (36.7 C) (Oral)   Ht 6' 2 (1.88 m)   Wt 264 lb (119.7 kg)   SpO2 98%   BMI 33.90 kg/m                 Constitutional: Pt appears in NAD               HENT: Head: NCAT.                Right Ear: External ear normal.  Left Ear: External ear normal.                Eyes: . Pupils are equal, round, and reactive to light. Conjunctivae and EOM are normal               Nose: without d/c or deformity               Neck: Neck supple. Gross normal ROM               Cardiovascular: Normal rate and regular rhythm.                 Pulmonary/Chest: Effort normal and breath sounds without rales or wheezing.                Abd:  Soft, NT, ND, + BS, no organomegaly               Neurological: Pt is alert. At baseline orientation, motor grossly intact               Skin: Skin is warm. No rashes, no other new lesions, LE edema - none               Psychiatric: Pt behavior is normal without agitation , mild depressed affect   Micro: none  Cardiac tracings I have personally interpreted today:  none  Pertinent Radiological findings (summarize): none   Lab Results  Component Value Date   WBC 8.3 12/06/2024   HGB 16.3 12/06/2024   HCT 47.2 12/06/2024   PLT 160.0 12/06/2024   GLUCOSE 116 (H) 12/06/2024   CHOL 184 12/06/2024   TRIG 326.0 (H)  12/06/2024   HDL 33.70 (L) 12/06/2024   LDLDIRECT 73.0 06/09/2023   LDLCALC 86 12/06/2024   ALT 32 12/06/2024   AST 22 12/06/2024   NA 136 12/06/2024   K 4.2 12/06/2024   CL 102 12/06/2024   CREATININE 0.99 12/06/2024   BUN 13 12/06/2024   CO2 27 12/06/2024   TSH 4.83 12/06/2024   PSA 0.32 12/06/2024   HGBA1C 6.1 12/06/2024   MICROALBUR <0.7 12/06/2024   Assessment/Plan:  William [name] is a 59 y.o. White or Caucasian [1] male with  has a past medical history of Anxiety, Degenerative arthritis of lumbar spine (12/12/2013), Diabetes mellitus without complication (HCC), Diverticulitis, Encounter for well adult exam with abnormal findings (07/12/2013), GERD (gastroesophageal reflux disease), Hypertension, and Sleep apnea.  Diabetes mellitus with diabetic neuropathy (HCC) Without insulin  Lab Results  Component Value Date   HGBA1C 6.1 12/06/2024   With worsening uncontrolled obesity,  pt to continue current medical treatment jardiance  10 every day, and increase the mounjaro  to 10 mg weekly   Encounter for well adult exam with abnormal findings Age and sex appropriate education and counseling updated with regular exercise and diet Referrals for preventative services - none needed Immunizations addressed - for tdap, shignrix at pharmacy Smoking counseling  - none needed Evidence for depression or other mood disorder - stable chronic mild persistent depression - declines tx for now Most recent labs reviewed. I have personally reviewed and have noted: 1) the patient's medical and social history 2) The patient's current medications and supplements 3) The patient's height, weight, and BMI have been recorded in the chart   Vitamin D  deficiency Last vitamin D  Lab Results  Component Value Date   VD25OH 17.95 (L) 12/06/2024   Low, to start oral replacement   Hyperlipidemia Lab Results  Component Value Date  LDLCALC 86 12/06/2024   uncontrolled, pt states will consider  repatha, but will call if wants this   Essential hypertension BP Readings from Last 3 Encounters:  12/09/24 120/80  07/01/24 131/85  12/28/23 128/82   Stable, pt to continue medical treatment toprol  xl 50 qd  Followup: Return in about 6 months (around 06/08/2025).  Lynwood Rush, MD 12/09/2024 1:01 PM [facility]     [1]  Allergies Allergen Reactions   Acetaminophen      Other reaction(s): Other (See Comments) made me feel misplaced   Blue Dyes (Parenteral)     Other reaction(s): Other (See Comments) Pt states he is just allergic to dyes Unknown as to what dye that is.  made my skin break out in hives   Cymbalta  [Duloxetine  Hcl] Nausea Only   Fenofibrate  Micronized Nausea And Vomiting   Ivp Dye [Iodinated Contrast Media] Hives    Per pt after CT with IV contrast aug 2014   Metformin  And Related Diarrhea   Niacin   [2]  Current Outpatient Medications on File Prior to Visit  Medication Sig Dispense Refill   ALPRAZolam  (XANAX ) 0.5 MG tablet TAKE 1 TABLET BY MOUTH TWICE A DAY AS NEEDED FOR ANXIETY 60 tablet 5   desvenlafaxine  (PRISTIQ ) 25 MG 24 hr tablet Take 1 tablet (25 mg total) by mouth daily. 90 tablet 0   No current facility-administered medications on file prior to visit.

## 2024-12-09 NOTE — Assessment & Plan Note (Signed)
 Age and sex appropriate education and counseling updated with regular exercise and diet Referrals for preventative services - none needed Immunizations addressed - for tdap, shignrix at pharmacy Smoking counseling  - none needed Evidence for depression or other mood disorder - stable chronic mild persistent depression - declines tx for now Most recent labs reviewed. I have personally reviewed and have noted: 1) the patient's medical and social history 2) The patient's current medications and supplements 3) The patient's height, weight, and BMI have been recorded in the chart

## 2024-12-09 NOTE — Patient Instructions (Addendum)
 Please take OTC Vitamin D3 at 2000 units per day, indefinitely  Please call if you would want to start the Repatha shot ever 2 wks  Ok to increase the Mounjaro  to 10 mg weekly  Please continue all other medications as before, and refills have been done if requested.  Please have the pharmacy call with any other refills you may need.  Please continue your efforts at being more active, low cholesterol diet, and weight control.  You are otherwise up to date with prevention measures today.  Please keep your appointments with your specialists as you may have planned  Please make an Appointment to return in 6 months, or sooner if needed

## 2024-12-09 NOTE — Assessment & Plan Note (Signed)
 Lab Results  Component Value Date   LDLCALC 86 12/06/2024   uncontrolled, pt states will consider repatha, but will call if wants this

## 2024-12-10 ENCOUNTER — Encounter: Payer: Self-pay | Admitting: Internal Medicine

## 2024-12-30 ENCOUNTER — Ambulatory Visit: Payer: Managed Care, Other (non HMO) | Admitting: Internal Medicine

## 2025-01-02 LAB — OPHTHALMOLOGY REPORT-SCANNED

## 2025-01-06 ENCOUNTER — Ambulatory Visit: Payer: Self-pay

## 2025-01-06 NOTE — Telephone Encounter (Signed)
 FYI Only or Action Required?: FYI only for provider: appointment scheduled on 2/3.  Patient was last seen in primary care on 12/09/2024 by Norleen Lynwood ORN, MD.  Called Nurse Triage reporting Abdominal Pain.  Symptoms began several days ago.  Interventions attempted: Nothing.  Symptoms are: gradually worsening.  Triage Disposition: See HCP Within 4 Hours (Or PCP Triage)  Patient/caregiver understands and will follow disposition?: Yes, but will wait   Summary: Severe abdominal pain, headache, fever   Reason for Triage: Patient states he has a case of Diverticulitis with the following symptoms:  -Severe abdominal pain -Headache -Fever (did  not have a degree)  Ongoing since yesterday. States he has had this back in 2023 and has bouts of this every so often and knows what it is and it only gets progressively worse without treatment.     Reason for Disposition  [1] MILD-MODERATE pain AND [2] constant AND [3] present > 2 hours  Answer Assessment - Initial Assessment Questions Pt declined UC. Only wants to be seen in office.  1. LOCATION: Where does it hurt?      Mid lower abd 2. RADIATION: Does the pain shoot anywhere else? (e.g., chest, back)     no 3. ONSET: When did the pain begin? (Minutes, hours or days ago)      Few days ago 4. SUDDEN: Gradual or sudden onset?     gradual 5. PATTERN Does the pain come and go, or is it constant?     constant 6. SEVERITY: How bad is the pain?  (e.g., Scale 1-10; mild, moderate, or severe)     Mild to moderate at this time 7. RECURRENT SYMPTOM: Have you ever had this type of stomach pain before? If Yes, ask: When was the last time? and What happened that time?      Few years ago, dx diverticulitis 8. CAUSE: What do you think is causing the stomach pain? (e.g., gallstones, recent abdominal surgery)     diverticulitis  10. OTHER SYMPTOMS: Do you have any other symptoms? (e.g., back pain, diarrhea, fever, urination pain,  vomiting)       ha  Protocols used: Abdominal Pain - Male-A-AH

## 2025-01-06 NOTE — Progress Notes (Unsigned)
" ° ° °  Subjective:    Patient ID: William [name], male    DOB: 04-Jul-1966, 59 y.o.   MRN: [id number]      HPI William [name] is here for No chief complaint on file.     Diverticulitis 2014  Last colonoscopy 04/2023.    Medications and allergies reviewed with patient and updated if appropriate.  Medications Ordered Prior to Encounter[1]  Review of Systems     Objective:  There were no vitals filed for this visit. BP Readings from Last 3 Encounters:  12/09/24 120/80  07/01/24 131/85  12/28/23 128/82   Wt Readings from Last 3 Encounters:  12/09/24 264 lb (119.7 kg)  07/01/24 243 lb (110.2 kg)  12/28/23 242 lb 3.2 oz (109.9 kg)   There is no height or weight on file to calculate BMI.    Physical Exam         Assessment & Plan:    See Problem List for Assessment and Plan of chronic medical problems.         [1]  Current Outpatient Medications on File Prior to Visit  Medication Sig Dispense Refill   ALPRAZolam  (XANAX ) 0.5 MG tablet TAKE 1 TABLET BY MOUTH TWICE A DAY AS NEEDED FOR ANXIETY 60 tablet 5   desvenlafaxine  (PRISTIQ ) 25 MG 24 hr tablet Take 1 tablet (25 mg total) by mouth daily. 90 tablet 0   empagliflozin  (JARDIANCE ) 10 MG TABS tablet Take 1 tablet (10 mg total) by mouth daily before breakfast. 90 tablet 3   metoprolol  succinate (TOPROL -XL) 50 MG 24 hr tablet Take 1 tablet (50 mg total) by mouth daily. Take with or immediately following a meal. 90 tablet 3   tirzepatide  (MOUNJARO ) 10 MG/0.5ML Pen Inject 10 mg into the skin once a week. 6 mL 3   No current facility-administered medications on file prior to visit.   "

## 2025-01-07 ENCOUNTER — Ambulatory Visit: Admitting: Internal Medicine

## 2025-01-07 ENCOUNTER — Encounter: Payer: Self-pay | Admitting: Internal Medicine

## 2025-01-07 VITALS — BP 128/74 | HR 75 | Temp 98.1°F | Ht 74.0 in | Wt 251.0 lb

## 2025-01-07 DIAGNOSIS — Z7985 Long-term (current) use of injectable non-insulin antidiabetic drugs: Secondary | ICD-10-CM

## 2025-01-07 DIAGNOSIS — E1140 Type 2 diabetes mellitus with diabetic neuropathy, unspecified: Secondary | ICD-10-CM | POA: Diagnosis not present

## 2025-01-07 DIAGNOSIS — K5732 Diverticulitis of large intestine without perforation or abscess without bleeding: Secondary | ICD-10-CM | POA: Diagnosis not present

## 2025-01-07 MED ORDER — AMOXICILLIN-POT CLAVULANATE 875-125 MG PO TABS: 1.0000 | ORAL_TABLET | Freq: Two times a day (BID) | ORAL | 0 refills | Status: AC

## 2025-01-07 NOTE — Patient Instructions (Addendum)
" ° ° ° ° ° °  Medications changes include :   Augmentin  twice daily x 10 days     Return if symptoms worsen or fail to improve.     Inflamed Colon (Diverticulitis): What to Know  Diverticulitis is when small pouches in your colon get infected or swollen. This causes pain in your belly (abdomen) and watery poop (diarrhea). The small pouches are called diverticula. They may form if you have a condition called diverticulosis. What are the causes? You may get this condition if poop (stool) gets trapped in the pouches in your colon. The poop lets germs (bacteria) grow. This causes an infection. What increases the risk? You are more likely to get this condition if you have small pouches in your colon. You are also more likely to get it if: You are overweight or very overweight (obese). You do not exercise enough. You drink alcohol. You smoke. You eat a lot of red meat, like beef, pork, or lamb. You do not eat enough fiber. You are older than 61 years of age. What are the signs or symptoms? Pain in your belly. Pain is often on the left side, but it may be felt in other spots too. Fever and chills. Feeling like you may vomit. Vomiting. Having cramps. Feeling full. Changes in how often you poop. Blood in your poop. How is this treated? Most cases are treated at home. You may be told to: Take over-the-counter pain medicines. Only eat and drink clear liquids. Take antibiotics. Rest. Very bad cases may need to be treated at a hospital. Treatment may include: Not eating or drinking. Taking pain medicines. Getting antibiotics through an IV tube. Getting fluid and food through an IV tube. Having surgery. When you are feeling better, you may need to have a test to look at your colon (colonoscopy). Follow these instructions at home: Medicines Take over-the-counter and prescription medicines only as told by your doctor. These include: Fiber pills. Probiotics. Medicines to make your  poop soft (stool softeners). If you were prescribed antibiotics, take them as told by your doctor. Do not stop taking them even if you start to feel better. Ask your doctor if you should avoid driving or using machines while you are taking your medicine. Eating and drinking  Follow the diet told by your doctor. You may need to only eat and drink liquids. When you feel better, you may be able to eat more foods. You may also be told to eat a lot of fiber. Fiber helps you poop. Foods with fiber include berries, beans, lentils, and green vegetables. Try not to eat red meat. General instructions Do not smoke or use any products that contain nicotine or tobacco. If you need help quitting, ask your doctor. Exercise 3 or more times a week. Try to go for 30 minutes each time. Exercise enough to sweat and make your heart beat faster. Contact a doctor if: Your pain gets worse. You are not pooping like normal. Your symptoms do not get better. Your symptoms get worse very fast. You have a fever. You vomit more than one time. You have poop that is: Bloody. Black. Tarry. This information is not intended to replace advice given to you by your health care provider. Make sure you discuss any questions you have with your health care provider. Document Revised: 09/29/2024 Document Reviewed: 08/18/2022 Elsevier Patient Education  2025 [employer]. "

## 2025-01-07 NOTE — Progress Notes (Signed)
 "   Subjective:    Patient ID: William [name], male    DOB: February 12, 1966, 59 y.o.   MRN: [id number]      HPI William [name] is here for  Chief Complaint  Patient presents with   Abdominal Pain    Abdominal pain in the middle of stomach; Patient has hx of diverticulitis 3rd bout (thinks it might be flare up) Pain level today is 3 but on Sunday he was at a 9. Sunday and Monday he took amoxicillin  that helped.      Diverticulitis 2014 - Ct A/P  Last colonoscopy 04/2023.   Discussed the use of AI scribe software for clinical note transcription with the patient, who gave verbal consent to proceed.  History of Present Illness William [name] is a 59 year old male with diverticulitis who presents with abdominal pain.  He experienced a twinge in the lower center of his abdomen on Saturday morning, which he recognized as a precursor to a diverticulitis flare-up. By Saturday evening, his condition worsened, and by Sunday morning, he had severe abdominal pain, headache, pain behind the eyes, jaw pain, and fever. The abdominal pain was unbearable and located a couple of inches under his belly button, similar to his previous episodes.  He has a history of diverticulitis diagnosed approximately 12-15 years ago, confirmed by a scan. He has had about four to five episodes over the years, with similar presentations each time. In past episodes, he was prescribed amoxicillin -clavulanate, which provided relief. During this episode, he took three leftover amoxicillin  pills from a previous prescription, which provided some improvement in symptoms by Monday, although he still experienced pain and discomfort.  No nausea, diarrhea, or changes in bowel habits, although he did not eat on Sunday and only ate in the evening on Monday, which may contribute to constipation. No blood in the stool or urinary symptoms. He drinks a lot of water and unsweetened tea, partly due to his diabetes, and has not experienced increased  thirst.  He has had a colonoscopy in 2024, which confirmed the presence of diverticula throughout the colon.  His current medications include Mounjaro  for diabetes, a blood pressure pill, and another diabetic medication, which he did not take during the recent episode. He also uses magnesium citrate powder to aid sleep and maintain regular bowel movements, although he has not used it during this episode.     Medications and allergies reviewed with patient and updated if appropriate.  Medications Ordered Prior to Encounter[1]  Review of Systems  Constitutional:  Positive for fatigue and fever.  Gastrointestinal:  Positive for abdominal pain. Negative for blood in stool (no melena), constipation, diarrhea and nausea.  Genitourinary:  Negative for dysuria, frequency, hematuria and urgency.  Neurological:  Positive for headaches.       Objective:   Vitals:   01/07/25 1020  BP: 128/74  Pulse: 75  Temp: 98.1 F (36.7 C)  SpO2: 96%   BP Readings from Last 3 Encounters:  01/07/25 128/74  12/09/24 120/80  07/01/24 131/85   Wt Readings from Last 3 Encounters:  01/07/25 251 lb (113.9 kg)  12/09/24 264 lb (119.7 kg)  07/01/24 243 lb (110.2 kg)   Body mass index is 32.23 kg/m.    Physical Exam Constitutional:      General: He is not in acute distress.    Appearance: Normal appearance. He is not ill-appearing.  HENT:     Head: Normocephalic and atraumatic.  Abdominal:     General: There  is no distension.     Palpations: Abdomen is soft.     Tenderness: There is abdominal tenderness (suprapubic region). There is no guarding or rebound.  Skin:    General: Skin is warm and dry.     Findings: No rash.  Neurological:     Mental Status: He is alert.            Assessment & Plan:    [name] Problem List for Assessment and Plan of chronic medical problems.    Assessment and Plan Assessment & Plan Diverticulitis of large intestine Recurrent diverticulitis with classic  symptoms. History and symptoms suggest diverticulitis. Discussed potential complications and surgical options for frequent or severe episodes. - Prescribed Augmentin  for 10 days. - Advised low fiber diet during antibiotics, then increase fiber post-treatment. - Avoid constipation; use magnesium citrate cautiously. - Consider CT scan for future episodes to assess area of involvement. - Discuss surgical consultation if episodes increase.  Type 2 diabetes mellitus Managed with Mounjaro  and oral medications. - Continue current diabetes management regimen.         [1]  Current Outpatient Medications on File Prior to Visit  Medication Sig Dispense Refill   ALPRAZolam  (XANAX ) 0.5 MG tablet TAKE 1 TABLET BY MOUTH TWICE A DAY AS NEEDED FOR ANXIETY 60 tablet 5   desvenlafaxine  (PRISTIQ ) 25 MG 24 hr tablet Take 1 tablet (25 mg total) by mouth daily. 90 tablet 0   empagliflozin  (JARDIANCE ) 10 MG TABS tablet Take 1 tablet (10 mg total) by mouth daily before breakfast. 90 tablet 3   metoprolol  succinate (TOPROL -XL) 50 MG 24 hr tablet Take 1 tablet (50 mg total) by mouth daily. Take with or immediately following a meal. 90 tablet 3   tirzepatide  (MOUNJARO ) 10 MG/0.5ML Pen Inject 10 mg into the skin once a week. 6 mL 3   No current facility-administered medications on file prior to visit.   "

## 2025-06-09 ENCOUNTER — Encounter: Admitting: Family Medicine
